# Patient Record
Sex: Male | Born: 1970 | Hispanic: Yes | Marital: Married | Smoking: Former smoker
Health system: Southern US, Community
[De-identification: ages and names within clinical notes are randomized; demographics above are authoritative.]

## PROBLEM LIST (undated history)

## (undated) DIAGNOSIS — K219 Gastro-esophageal reflux disease without esophagitis: Secondary | ICD-10-CM

## (undated) DIAGNOSIS — R519 Headache, unspecified: Secondary | ICD-10-CM

## (undated) DIAGNOSIS — R51 Headache: Secondary | ICD-10-CM

## (undated) HISTORY — DX: Headache, unspecified: R51.9

## (undated) HISTORY — DX: Headache: R51

## (undated) HISTORY — DX: Gastro-esophageal reflux disease without esophagitis: K21.9

---

## 2005-05-19 ENCOUNTER — Emergency Department: Payer: Self-pay | Admitting: Emergency Medicine

## 2013-02-13 ENCOUNTER — Emergency Department: Payer: Self-pay | Admitting: Internal Medicine

## 2013-04-02 DIAGNOSIS — M792 Neuralgia and neuritis, unspecified: Secondary | ICD-10-CM | POA: Insufficient documentation

## 2013-04-02 DIAGNOSIS — E785 Hyperlipidemia, unspecified: Secondary | ICD-10-CM | POA: Insufficient documentation

## 2014-01-15 ENCOUNTER — Encounter: Payer: Self-pay | Admitting: Internal Medicine

## 2014-01-15 ENCOUNTER — Ambulatory Visit (INDEPENDENT_AMBULATORY_CARE_PROVIDER_SITE_OTHER): Payer: BC Managed Care – PPO | Admitting: Internal Medicine

## 2014-01-15 VITALS — BP 116/64 | HR 75 | Temp 97.9°F | Ht 65.5 in | Wt 153.5 lb

## 2014-01-15 DIAGNOSIS — Z0000 Encounter for general adult medical examination without abnormal findings: Secondary | ICD-10-CM

## 2014-01-15 DIAGNOSIS — N410 Acute prostatitis: Secondary | ICD-10-CM

## 2014-01-15 LAB — COMPREHENSIVE METABOLIC PANEL
ALK PHOS: 80 U/L (ref 39–117)
ALT: 28 U/L (ref 0–53)
AST: 24 U/L (ref 0–37)
Albumin: 4.4 g/dL (ref 3.5–5.2)
BUN: 17 mg/dL (ref 6–23)
CO2: 29 mEq/L (ref 19–32)
Calcium: 9.4 mg/dL (ref 8.4–10.5)
Chloride: 103 mEq/L (ref 96–112)
Creatinine, Ser: 0.7 mg/dL (ref 0.4–1.5)
GFR: 127.98 mL/min (ref >60.00)
GLUCOSE: 86 mg/dL (ref 70–99)
Potassium: 4.1 mEq/L (ref 3.5–5.1)
Sodium: 138 mEq/L (ref 135–145)
Total Bilirubin: 0.3 mg/dL (ref 0.3–1.2)
Total Protein: 7.2 g/dL (ref 6.0–8.3)

## 2014-01-15 LAB — LIPID PANEL
CHOLESTEROL: 191 mg/dL (ref 0–200)
HDL: 41.9 mg/dL (ref >39.00)
LDL Cholesterol: 95 mg/dL (ref 0–99)
Total CHOL/HDL Ratio: 5
Triglycerides: 272 mg/dL — ABNORMAL HIGH (ref 0.0–149.0)
VLDL: 54.4 mg/dL — ABNORMAL HIGH (ref 0.0–40.0)

## 2014-01-15 LAB — PSA: PSA: 0.71 ng/mL (ref 0.10–4.00)

## 2014-01-15 LAB — CBC
HEMATOCRIT: 43.8 % (ref 39.0–52.0)
Hemoglobin: 15 g/dL (ref 13.0–17.0)
MCHC: 34.3 g/dL (ref 30.0–36.0)
MCV: 88.9 fl (ref 78.0–100.0)
Platelets: 180 10*3/uL (ref 150.0–400.0)
RBC: 4.93 Mil/uL (ref 4.22–5.81)
RDW: 12.9 % (ref 11.5–14.6)
WBC: 4 10*3/uL — ABNORMAL LOW (ref 4.5–10.5)

## 2014-01-15 MED ORDER — SULFAMETHOXAZOLE-TMP DS 800-160 MG PO TABS
1.0000 | ORAL_TABLET | Freq: Two times a day (BID) | ORAL | Status: DC
Start: 2014-01-15 — End: 2014-03-01

## 2014-01-15 NOTE — Progress Notes (Signed)
HPI  Pt presents to the clinic today to establish care. He has not had a PCP in many years. He does have some concerns today about a pain inside his rectum. He reports this started 1 week ago. He denies fever, chills, dysuria, but feels like his urine stream stops and starts. He denies blood in his urine. He reports he has felt like this in the past before when he had a prostate infection. He reports this was many years ago.  Flu: never Tetanus: 2012 Eye Doctor: as needed Dentist: biannually  Past Medical History  Diagnosis Date  . GERD (gastroesophageal reflux disease)     No current outpatient prescriptions on file.   No current facility-administered medications for this visit.    No Known Allergies  Family History  Problem Relation Age of Onset  . Diabetes Maternal Aunt   . Diabetes Maternal Uncle   . Cancer Neg Hx   . Stroke Neg Hx   . Heart disease Neg Hx     History   Social History  . Marital Status: Married    Spouse Name: N/A    Number of Children: N/A  . Years of Education: N/A   Occupational History  . Not on file.   Social History Main Topics  . Smoking status: Never Smoker   . Smokeless tobacco: Not on file  . Alcohol Use: 7.2 oz/week    12 Cans of beer per week     Comment: occasional  . Drug Use: No  . Sexual Activity: Yes   Other Topics Concern  . Not on file   Social History Narrative  . No narrative on file    ROS:  Constitutional: Denies fever, malaise, fatigue, headache or abrupt weight changes.  HEENT: Denies eye pain, eye redness, ear pain, ringing in the ears, wax buildup, runny nose, nasal congestion, bloody nose, or sore throat. Respiratory: Denies difficulty breathing, shortness of breath, cough or sputum production.   Cardiovascular: Denies chest pain, chest tightness, palpitations or swelling in the hands or feet.  Gastrointestinal: Pt reports rectal pain. Denies abdominal pain, bloating, constipation, diarrhea or blood in the  stool.  GU: Pt reports hesitancy. Denies frequency, urgency, pain with urination, blood in urine, odor or discharge. Musculoskeletal: Denies decrease in range of motion, difficulty with gait, muscle pain or joint pain and swelling.  Skin: Denies redness, rashes, lesions or ulcercations.  Neurological: Denies dizziness, difficulty with memory, difficulty with speech or problems with balance and coordination.   No other specific complaints in a complete review of systems (except as listed in HPI above).  PE:  BP 116/64  Pulse 75  Temp(Src) 97.9 F (36.6 C) (Oral)  Ht 5' 5.5" (1.664 m)  Wt 153 lb 8 oz (69.627 kg)  BMI 25.15 kg/m2  SpO2 98% Wt Readings from Last 3 Encounters:  01/15/14 153 lb 8 oz (69.627 kg)    General: Appears his stated age, well developed, well nourished in NAD. HEENT: Head: normal shape and size; Eyes: sclera white, no icterus, conjunctiva pink, PERRLA and EOMs intact; Ears: Tm's gray and intact, normal light reflex; Nose: mucosa pink and moist, septum midline; Throat/Mouth: Teeth present, mucosa pink and moist, no lesions or ulcerations noted.  Neck: Normal range of motion. Neck supple, trachea midline. No massses, lumps or thyromegaly present.  Cardiovascular: Normal rate and rhythm. S1,S2 noted.  No murmur, rubs or gallops noted. No JVD or BLE edema. No carotid bruits noted. Pulmonary/Chest: Normal effort and positive vesicular breath  sounds. No respiratory distress. No wheezes, rales or ronchi noted.  Abdomen: Soft and nontender. Normal bowel sounds, no bruits noted. No distention or masses noted. Liver, spleen and kidneys non palpable. Rectal: he declines today. Musculoskeletal: Normal range of motion. No signs of joint swelling. No difficulty with gait.  Neurological: Alert and oriented. Cranial nerves II-XII intact. Coordination normal. +DTRs bilaterally. Psychiatric: Mood and affect normal. Behavior is normal. Judgment and thought content normal.       Assessment and Plan:  Preventative Health Maintenance:  All HM UTD Will check screening labs today  Acute prostatis:  Will check CBC and PSA today eRx for Septra BID x 4 weeks  RTC as needed or if pain persist or worsens

## 2014-01-15 NOTE — Patient Instructions (Addendum)
Prostatitis ( Prostatitis) La prstata tiene aproximadamente el tamao y la forma de Sharee Pimple. Se ubica justo por abajo de la vejiga. Produce uno de los componentes del semen, formado por los espermatozoides y los lquidos que ayudan a nutrirlos y transportarlos fuera de los testculos. La prostatitis es una inflamacin de la glndula prosttica.  Hay cuatro tipos de prostatitis:  Prostatitis bacteriana aguda: es el tipo menos frecuente de prostatitis. Comienza rpidamente y, por lo general, est acompaada por una infeccin de la vejiga, fiebre alta y escalofros. Puede ocurrir a Actuary.  Prostatitis bacteriana crnica: es una infeccin bacteriana persistente en la prstata. Generalmente aparece luego de una prostatitis bacteriana aguda que se repite o que no fue tratada adecuadamente. Se puede presentar en hombres de cualquier edad pero es ms comn en los hombres de mediana edad cuya prstata ha comenzado a Government social research officer. Los sntomas no son tan graves como los de la prostatitis [place]. La principal [employer] ser en la parte del cuerpo que est delante del recto y debajo del escroto (perineo), en la parte baja del abdomen o en la punta del pene (glande).   Prostatitis crnica (no bacteriana): es el tipo ms frecuente de prostatitis. Es la inflamacin de la prstata que no se origina en una infeccin bacteriana Se desconoce su causa y puede estar asociada con infecciones virales o trastornos autoinmunitarios.  Prostatodinia (trastorno del suelo plvico): est asociada a un tono muscular elevado en la pelvis que rodea a la prstata. CAUSAS Las causas de la prostatitis bacteriana es la infeccin bacteriana. Se desconocen las causas de los otros tipos de prostatitis.  SNTOMAS  Los sntomas pueden variar segn el tipo de prostatitis. Tambin puede ocurrir que coincidan sntomas de diferentes tipos de prostatitis. Los sntomas posibles para cada tipo de prostatitis se enumeran a  continuacin. Prostatitis bacteriana aguda  Dolor al [employer].  Fiebre o escalofros.  Dolor muscular o en las articulaciones.  Dolor lumbar.  Dolor abdominal en la zona inferior del abdomen.  Imposibilidad de vaciar la vejiga completamente. Prostatitis bacteriana crnica, prostatitis crnica no bacteriana y prostatodinia  Congo urgente y repentina de Geographical information systems officer.  Orinar con frecuencia.  Dificultad para comenzar a eliminar la orina.  Chorro de orina dbil.  Secrecin por Engineer, mining.  Goteo al terminar de Geographical information systems officer.  Dolor rectal.  Dolor en los testculos, el pene, o punta del pene.  Dolor en el perineo.  Problemas con la funcin sexual.  Eyaculacin dolorosa.  Semen con sangre. DIAGNSTICO  Su mdico le preguntar acerca de sus sntomas para hacer el diagnstico de prostatitis. Se recolectarn y analizarn una o ms muestras de [place] (anlisis de [place]). Si el resultado del anlisis de [place] es negativo para bacterias, su mdico podr palpar su prstata con un dedo (examen dgito rectal). Este examen ayuda a su mdico a determinar si la prstata est inflamada y sensible. Tambin se obtendr Lauris Poag de semen para analizarla. TRATAMIENTO  El tratamiento de la prostatitis depende de la causa. Si una infeccin bacteriana es la causa, puede ser tratada con antibiticos. En los casos de prostatitis bacteriana crnica, puede ser necesario el uso de antibiticos durante un de un mes o seis semanas. Su mdico puede indicarle que tome baos de asiento para ayudar a Engineer, materials. Un bao de asiento es un bao de agua caliente en la que las caderas y las nalgas estn bajo el agua. Esto relaja los msculos del suelo plvico y, a menudo, contribuye a Technical sales engineer presin  en la prstata. INSTRUCCIONES PARA EL CUIDADO EN EL HOGAR   Tome todos los medicamentos como le indic el mdico.  Tome baos de asiento segn las indicaciones del mdico. SOLICITE ATENCIN MDICA SI:   Los  sntomas empeoran en lugar de mejorar.  Tiene fiebre. SOLICITE ATENCIN MDICA DE INMEDIATO SI:   Tiene escalofros.  Siente nuseas o vomita.  Se siente mareado o se desmaya.  No puede orinar.  Tiene sangre o cogulos de sangre en la orina. Document Released: 06/16/2005 Document Revised: 06/27/2013 [facility] Patient Information 2014 [place]Care, [place]. Prostatitis The prostate gland is about the size and shape of a walnut. It is located just below your bladder. It produces one of the components of semen, which is made up of sperm and the fluids that help nourish and transport it out from the testicles. Prostatitis is inflammation of the prostate gland.  There are four types of prostatitis:  Acute bacterial prostatitis This is the least common type of prostatitis. It starts quickly and usually is associated with a bladder infection, high fever, and shaking chills. It can occur at any age.  Chronic bacterial prostatitis This is a persistent bacterial infection in the prostate. It usually develops from repeated acute bacterial prostatitis or acute bacterial prostatitis that was not properly treated. It can occur in men of any age but is most common in middle-aged men whose prostate has begun to enlarge. The symptoms are not as severe as those in acute bacterial prostatitis. Discomfort in the part of your body that is in front of your rectum and below your scrotum (perineum), lower abdomen, or in the head of your penis (glans) may represent your primary discomfort.  Chronic prostatitis (nonbacterial) This is the most common type of prostatitis. It is inflammation of the prostate gland that is not caused by a bacterial infection. The cause is unknown and may be associated with a viral infection or autoimmune disorder.  Prostatodynia (pelvic floor disorder) This is associated with increased muscular tone in the pelvis surrounding the prostate. CAUSES The causes of bacterial prostatitis are  bacterial infection. The causes of the other types of prostatitis are unknown.  SYMPTOMS  Symptoms can vary depending upon the type of prostatitis that exists. There can also be overlap in symptoms. Possible symptoms for each type of prostatitis are listed below. Acute Bacterial Prostatitis  Painful urination.  Fever or chills.  Muscle or joint pains.  Low back pain.  Low abdominal pain.  Inability to empty bladder completely. Chronic Bacterial Prostatitis, Chronic Nonbacterial Prostatitis, and Prostatodynia  Sudden urge to urinate.  Frequent urination.  Difficulty starting urine stream.  Weak urine stream.  Discharge from the urethra.  Dribbling after urination.  Rectal pain.  Pain in the testicles, penis, or tip of the penis.  Pain in the perineum.  Problems with sexual function.  Painful ejaculation.  Bloody semen. DIAGNOSIS  In order to diagnose prostatitis, your health care provider will ask about your symptoms. One or more urine samples will be taken and tested (urinalysis). If the urinalysis result is negative for bacteria, your health care provider may use a finger to feel your prostate (digital rectal exam). This exam helps your health care provider determine if your prostate is swollen and tender. It will also produce a specimen of semen that can be analyzed. TREATMENT  Treatment for prostatitis depends on the cause. If a bacterial infection is the cause, it can be treated with antibiotic medicine. In cases of chronic bacterial prostatitis, the use  of antibiotics for up to 1 month or 6 weeks may be necessary. Your health care provider may instruct you to take sitz baths to help relieve pain. A sitz bath is a bath of hot water in which your hips and buttocks are under water. This relaxes the pelvic floor muscles and often helps to relieve the pressure on your prostate. HOME CARE INSTRUCTIONS   Take all medicines as directed by your health care provider.  Take  sitz baths as directed by your health care provider. SEEK MEDICAL CARE IF:   Your symptoms get worse, not better.  You have a fever. SEEK IMMEDIATE MEDICAL CARE IF:   You have chills.  You feel nauseous or vomit.  You feel lightheaded or faint.  You are unable to urinate.  You have blood or blood clots in your urine. Document Released: 09/03/2000 Document Revised: 06/27/2013 Document Reviewed: 03/26/2013 [facility] Patient Information 2014 [facility] CollegeExitCare, MarylandLLC.

## 2014-01-15 NOTE — Progress Notes (Signed)
Pre visit review using our clinic review tool, if applicable. No additional management support is needed unless otherwise documented below in the visit note. 

## 2014-03-01 ENCOUNTER — Encounter: Payer: Self-pay | Admitting: Family Medicine

## 2014-03-01 ENCOUNTER — Ambulatory Visit: Payer: BC Managed Care – PPO | Admitting: Internal Medicine

## 2014-03-01 ENCOUNTER — Ambulatory Visit (INDEPENDENT_AMBULATORY_CARE_PROVIDER_SITE_OTHER): Payer: BC Managed Care – PPO | Admitting: Family Medicine

## 2014-03-01 ENCOUNTER — Ambulatory Visit: Payer: BC Managed Care – PPO | Admitting: Family Medicine

## 2014-03-01 VITALS — BP 124/84 | HR 60 | Temp 98.3°F | Wt 156.8 lb

## 2014-03-01 DIAGNOSIS — M79673 Pain in unspecified foot: Secondary | ICD-10-CM

## 2014-03-01 DIAGNOSIS — N342 Other urethritis: Secondary | ICD-10-CM | POA: Insufficient documentation

## 2014-03-01 DIAGNOSIS — M79609 Pain in unspecified limb: Secondary | ICD-10-CM

## 2014-03-01 DIAGNOSIS — R300 Dysuria: Secondary | ICD-10-CM

## 2014-03-01 LAB — POCT URINALYSIS DIPSTICK
Bilirubin, UA: NEGATIVE
Blood, UA: NEGATIVE
Glucose, UA: NEGATIVE
KETONES UA: NEGATIVE
LEUKOCYTES UA: NEGATIVE
Nitrite, UA: NEGATIVE
PH UA: 8
Protein, UA: NEGATIVE
SPEC GRAV UA: 1.01
Urobilinogen, UA: 0.2

## 2014-03-01 MED ORDER — CEFTRIAXONE SODIUM 1 G IJ SOLR
250.0000 mg | Freq: Once | INTRAMUSCULAR | Status: AC
  Administered 2014-03-01: 250 mg via INTRAMUSCULAR

## 2014-03-01 MED ORDER — DOXYCYCLINE HYCLATE 100 MG PO CAPS: 100.0000 mg | ORAL_CAPSULE | Freq: Two times a day (BID) | ORAL | Status: DC

## 2014-03-01 NOTE — Patient Instructions (Addendum)
Creo que la prostatitis que tuvo recientemente ha sido tratado. Creo todavia tiene infeccion - medicina recetada hoy para esto - inyeccion y doxycycline 100mg  dos veces al dia por 1 semana. Para la fatiga, demos mas tiempo - si sigue con este problema, dejenos saber para de pronto revisar mas laboratorios. Siga tomando [place] O[place]Lakesmucha agua. Para los pies - puede usar ibuprofeno or advil 400mg  dos veces al dia con cominda.

## 2014-03-01 NOTE — Progress Notes (Signed)
BP 124/84  Pulse 60  Temp(Src) 98.3 F (36.8 C) (Oral)  Wt 156 lb 12 oz (71.101 kg)   CC: dysuria  Subjective:    Patient ID: Alejandro Barr, male    DOB: 04/21/1971, 43 y.o.   MRN: [id number]  HPI: Alejandro Barr is a 43 y.o. male presenting on 03/01/2014 for burning with urination   Husband of patient of mine Alejandro Barr. Spanish speaking.Alejandro Barr.  Alejandro Barr presents with 1 mo h/o penile discomfort described as burn.  Occasional abd discomfort but difficulty describing. Some dysuria, some constipation. Some early ejaculation (longstanding).   Drinking plenty of water.  1 yr ago did have prostatitis. Again had this 1 mo ago - saw Alejandro Barr 01/15/2014 with rectal pain, dx prostatitis and treated with 4wk course bactrim BID, pt reports finishing abx and did feel better while on antibiotics.  Now main complaint is penile discomfort.  Denies fevers/chills, nausea/vomiting, diarrhea. Denies urgency or frequency or hematuria. No urethral discharge. Denies new sexual partners, endorses monogamous relationship.  For last 2 weeks also noticing increase in fatigue as well as decreased libido, some mild depressive sxs.  Also describes longstanding R sole burning pain with ambulation - improved after he bought custom insoles from [employer] but still somewhat present.  Relevant past medical, surgical, family and social history reviewed and updated as indicated.  Allergies and medications reviewed and updated. No current outpatient prescriptions on file prior to visit.   No current facility-administered medications on file prior to visit.    Review of Systems Per HPI unless specifically indicated above    Objective:    BP 124/84  Pulse 60  Temp(Src) 98.3 F (36.8 C) (Oral)  Wt 156 lb 12 oz (71.101 kg)  Physical Exam  Nursing note and vitals reviewed. Constitutional: He appears well-developed and well-nourished. No distress.  HENT:  Mouth/Throat: Oropharynx is clear and moist. No  oropharyngeal exudate.  Cardiovascular: Normal rate, regular rhythm, normal heart sounds and intact distal pulses.   No murmur heard. Pulmonary/Chest: Effort normal and breath sounds normal. No respiratory distress. He has no wheezes. He has no rales.  Abdominal: Soft. Normal appearance and bowel sounds are normal. He exhibits no distension and no mass. There is no tenderness. There is no rigidity, no rebound, no guarding, no CVA tenderness and negative Murphy's sign.  Genitourinary: Rectum normal, prostate normal, testes normal and penis normal. Rectal exam shows no external hemorrhoid, no internal hemorrhoid, no fissure, no mass, no tenderness and anal tone normal. Prostate is not enlarged (20gm) and not tender. Right testis shows no mass, no swelling and no tenderness. Right testis is descended. Left testis shows no mass, no swelling and no tenderness. Left testis is descended. Uncircumcised.  Musculoskeletal: He exhibits no edema.  Lymphadenopathy:       Right: No inguinal adenopathy present.       Left: No inguinal adenopathy present.  Skin: Skin is warm and dry. No rash noted.   Results for orders placed in visit on 03/01/14  POCT URINALYSIS DIPSTICK      Result Value Ref Range   Color, UA Yellow     Clarity, UA Clear     Glucose, UA Negative     Bilirubin, UA Negative     Ketones, UA Negative     Spec Grav, UA 1.010     Blood, UA Negative     pH, UA 8.0     Protein, UA Negative     Urobilinogen, UA 0.2  Nitrite, UA Negative     Leukocytes, UA Negative        Assessment & Plan:   Problem List Items Addressed This Visit   Urethritis - Primary     Penile burn most consistent with urethritis, ddx includes UTI, STD. GU exam WNL today, prostate exam as well without pain. Will treat with shot CTX 250mg  IM as well as doxy 7d course. Update if sxs persist or deteriorate. If persistent dysthymia, fatigue and decreased libido, consider testing for testosterone.    Relevant  Medications      cefTRIAXone (ROCEPHIN) injection 250 mg (Completed)   Other Relevant Orders      Urine culture   Foot pain     R sided - ?plantar fasciitis - provided with stretching exercises from SM pt advisor. Also recommended OTC NSAID with food.     Other Visit Diagnoses   Burning with urination        Relevant Medications       cefTRIAXone (ROCEPHIN) injection 250 mg (Completed)    Other Relevant Orders       POCT Urinalysis Dipstick (Completed)        Follow up plan: Return if symptoms worsen or fail to improve.

## 2014-03-01 NOTE — Progress Notes (Signed)
Pre visit review using our clinic review tool, if applicable. No additional management support is needed unless otherwise documented below in the visit note. 

## 2014-03-02 DIAGNOSIS — M79673 Pain in unspecified foot: Secondary | ICD-10-CM | POA: Insufficient documentation

## 2014-03-02 NOTE — Assessment & Plan Note (Signed)
Penile burn most consistent with urethritis, ddx includes UTI, STD. GU exam WNL today, prostate exam as well without pain. Will treat with shot CTX 250mg  IM as well as doxy 7d course. Update if sxs persist or deteriorate. If persistent dysthymia, fatigue and decreased libido, consider testing for testosterone.

## 2014-03-02 NOTE — Assessment & Plan Note (Addendum)
R sided - ?plantar fasciitis - provided with stretching exercises from [facility] pt advisor. Also recommended OTC NSAID with food.

## 2014-03-04 ENCOUNTER — Telehealth: Payer: Self-pay | Admitting: Radiology

## 2014-03-04 NOTE — Telephone Encounter (Signed)
Lm with his wife to have the patient return to leave a clean catch urine for culture.

## 2014-03-08 LAB — URINE CULTURE
Colony Count: NO GROWTH
Organism ID, Bacteria: NO GROWTH

## 2014-12-12 ENCOUNTER — Encounter: Payer: Self-pay | Admitting: Family Medicine

## 2014-12-12 ENCOUNTER — Ambulatory Visit (INDEPENDENT_AMBULATORY_CARE_PROVIDER_SITE_OTHER): Payer: BLUE CROSS/BLUE SHIELD | Admitting: Family Medicine

## 2014-12-12 VITALS — BP 110/70 | HR 60 | Temp 97.9°F | Resp 16 | Wt 159.8 lb

## 2014-12-12 DIAGNOSIS — R51 Headache: Secondary | ICD-10-CM | POA: Diagnosis not present

## 2014-12-12 DIAGNOSIS — R519 Headache, unspecified: Secondary | ICD-10-CM | POA: Insufficient documentation

## 2014-12-12 NOTE — Patient Instructions (Signed)
Use mascara en el trabajo. Puede usar tylenol si es necesario para dolor - 500-1000mg . Si esta teniendo que usar tylenol mas de 4 veces a la semana dejeme saber.  Creo que alergias estan contribuyendo al dolor de [place]. Empieze flonase.  Dejeme saber como sigue.

## 2014-12-12 NOTE — Assessment & Plan Note (Addendum)
nonfocal neurological exam. Anticipate sinus congestion headache. Treat with flonase (pt has at home). Discussed tylenol use.  Update if not improving with treatment. Also recommended he schedule vision exam with eye doctor (has not had in past).

## 2014-12-12 NOTE — Progress Notes (Signed)
BP 110/70 mmHg  Pulse 60  Temp(Src) 97.9 F (36.6 C) (Oral)  Resp 16  Wt 159 lb 12.8 oz (72.485 kg)  SpO2 98%   CC: headcahe  Subjective:    Patient ID: Alejandro Barr, male    DOB: [birth year], 44 y.o.   MRN: [id number]  HPI: Alejandro Barr is a 44 y.o. male presenting on 12/12/2014 for Headache   2-3 wk h/o bitemporal headache that radiates to entire head. Intermittently. Wakes up in am with headache, feels also at night time. Yesterday constantly present. Sinus pressure / congestion > pounding ache. Yesterday had some blurry vision and dizziness and nausea. Occasional allergy sxs.   No fevers/chills, vomiting, photo/phonophobia. Thinks he's staying well hydrated. No neck pain or stiffness.   Has had similar headache - about 17 yrs ago. Due to chemical inhalation.   Hasn't tried med for this yet other than tylenol (yesterday ) which resolved headache, currently without any.  Denies chemical exposure other than smoke at work. welds iron pipes at work. No recent travel.  Limited caffeine - 1 coffee per day, rare soda.  Occasional energy drink in am.  Sleeping 7-8 hours on average.  Relevant past medical, surgical, family and social history reviewed and updated as indicated. Interim medical history since our last visit reviewed. Allergies and medications reviewed and updated. No current outpatient prescriptions on file prior to visit.   No current facility-administered medications on file prior to visit.    Review of Systems Per HPI unless specifically indicated above     Objective:    BP 110/70 mmHg  Pulse 60  Temp(Src) 97.9 F (36.6 C) (Oral)  Resp 16  Wt 159 lb 12.8 oz (72.485 kg)  SpO2 98%  Wt Readings from Last 3 Encounters:  12/12/14 159 lb 12.8 oz (72.485 kg)  03/01/14 156 lb 12 oz (71.101 kg)  01/15/14 153 lb 8 oz (69.627 kg)    Physical Exam  Constitutional: He is oriented to person, place, and time. He appears well-developed and well-nourished.  No distress.  HENT:  Head: Normocephalic and atraumatic.  Right Ear: Hearing, tympanic membrane, external ear and ear canal normal.  Left Ear: Hearing, tympanic membrane, external ear and ear canal normal.  Nose: Mucosal edema (mild) present. No rhinorrhea. Right sinus exhibits no maxillary sinus tenderness and no frontal sinus tenderness. Left sinus exhibits no maxillary sinus tenderness and no frontal sinus tenderness.  Mouth/Throat: Uvula is midline, oropharynx is clear and moist and mucous membranes are normal. No oropharyngeal exudate, posterior oropharyngeal edema, posterior oropharyngeal erythema or tonsillar abscesses.  Eyes: Conjunctivae and EOM are normal. Pupils are equal, round, and reactive to light. No scleral icterus.  Neck: Normal range of motion. Neck supple.  Cardiovascular: Normal rate, regular rhythm, normal heart sounds and intact distal pulses.   No murmur heard. Pulmonary/Chest: Effort normal and breath sounds normal. No respiratory distress. He has no wheezes. He has no rales.  Lymphadenopathy:    He has no cervical adenopathy.  Neurological: He is alert and oriented to person, place, and time. No cranial nerve deficit. He displays a negative Romberg sign. Coordination and gait normal.  CN 2-12  FTN intact No pronator drift  Skin: Skin is warm and dry. No rash noted.  Nursing note and vitals reviewed.     Assessment & Plan:   Problem List Items Addressed This Visit    Headache - Primary    nonfocal neurological exam. Anticipate sinus congestion headache. Treat with flonase (pt has  at home). Discussed tylenol use.  Update if not improving with treatment. Also recommended he schedule vision exam with eye doctor (has not had in past).      Relevant Medications   acetaminophen (TYLENOL) 500 MG tablet       Follow up plan: Return if symptoms worsen or fail to improve.

## 2014-12-28 ENCOUNTER — Emergency Department
Admit: 2014-12-28 | Disposition: A | Discharge status: Home / Self Care | Payer: Self-pay | Admitting: Physician Assistant

## 2017-03-30 DIAGNOSIS — J342 Deviated nasal septum: Secondary | ICD-10-CM | POA: Diagnosis not present

## 2017-03-30 DIAGNOSIS — K131 Cheek and lip biting: Secondary | ICD-10-CM | POA: Diagnosis not present

## 2017-03-30 DIAGNOSIS — K141 Geographic tongue: Secondary | ICD-10-CM | POA: Diagnosis not present

## 2017-03-30 DIAGNOSIS — K0820 Unspecified atrophy of edentulous alveolar ridge: Secondary | ICD-10-CM | POA: Diagnosis not present

## 2017-03-30 DIAGNOSIS — Z1281 Encounter for screening for malignant neoplasm of oral cavity: Secondary | ICD-10-CM | POA: Diagnosis not present

## 2017-03-30 DIAGNOSIS — K05329 Chronic periodontitis, generalized, unspecified severity: Secondary | ICD-10-CM | POA: Diagnosis not present

## 2017-06-24 DIAGNOSIS — Z23 Encounter for immunization: Secondary | ICD-10-CM | POA: Diagnosis not present

## 2017-09-19 ENCOUNTER — Encounter: Payer: Self-pay | Admitting: Emergency Medicine

## 2017-09-19 ENCOUNTER — Emergency Department
Admission: EM | Admit: 2017-09-19 | Discharge: 2017-09-19 | Disposition: A | Discharge status: Home / Self Care | Payer: BLUE CROSS/BLUE SHIELD | Attending: Emergency Medicine | Admitting: Emergency Medicine

## 2017-09-19 ENCOUNTER — Emergency Department: Payer: BLUE CROSS/BLUE SHIELD

## 2017-09-19 ENCOUNTER — Other Ambulatory Visit: Payer: Self-pay

## 2017-09-19 DIAGNOSIS — R42 Dizziness and giddiness: Secondary | ICD-10-CM | POA: Diagnosis not present

## 2017-09-19 LAB — CBC
HEMATOCRIT: 46.1 % (ref 40.0–52.0)
Hemoglobin: 15.7 g/dL (ref 13.0–18.0)
MCH: 30.1 pg (ref 26.0–34.0)
MCHC: 34 g/dL (ref 32.0–36.0)
MCV: 88.4 fL (ref 80.0–100.0)
Platelets: 164 10*3/uL (ref 150–440)
RBC: 5.21 MIL/uL (ref 4.40–5.90)
RDW: 13 % (ref 11.5–14.5)
WBC: 5.2 10*3/uL (ref 3.8–10.6)

## 2017-09-19 LAB — URINALYSIS, COMPLETE (UACMP) WITH MICROSCOPIC
BACTERIA UA: NONE SEEN
Bilirubin Urine: NEGATIVE
Glucose, UA: NEGATIVE mg/dL
Hgb urine dipstick: NEGATIVE
KETONES UR: NEGATIVE mg/dL
LEUKOCYTES UA: NEGATIVE
Nitrite: NEGATIVE
PROTEIN: NEGATIVE mg/dL
SQUAMOUS EPITHELIAL / LPF: NONE SEEN
Specific Gravity, Urine: 1.017 (ref 1.005–1.030)
pH: 8 (ref 5.0–8.0)

## 2017-09-19 LAB — BASIC METABOLIC PANEL
Anion gap: 9 (ref 5–15)
BUN: 15 mg/dL (ref 6–20)
CHLORIDE: 106 mmol/L (ref 101–111)
CO2: 25 mmol/L (ref 22–32)
CREATININE: 0.7 mg/dL (ref 0.61–1.24)
Calcium: 9.5 mg/dL (ref 8.9–10.3)
GFR calc non Af Amer: >60 mL/min (ref >60)
Glucose, Bld: 124 mg/dL — ABNORMAL HIGH (ref 65–99)
POTASSIUM: 3.9 mmol/L (ref 3.5–5.1)
Sodium: 140 mmol/L (ref 135–145)

## 2017-09-19 LAB — TROPONIN I

## 2017-09-19 MED ORDER — MECLIZINE HCL 25 MG PO TABS: 25.0000 mg | ORAL_TABLET | Freq: Three times a day (TID) | ORAL | 0 refills | Status: AC | PRN

## 2017-09-19 MED ORDER — MECLIZINE HCL 25 MG PO TABS
50.0000 mg | ORAL_TABLET | Freq: Once | ORAL | Status: AC
  Administered 2017-09-19: 50 mg via ORAL
  Filled 2017-09-19: qty 2

## 2017-09-19 MED ORDER — SODIUM CHLORIDE 0.9 % IV BOLUS (SEPSIS)
1000.0000 mL | Freq: Once | INTRAVENOUS | Status: AC
  Administered 2017-09-19: 1000 mL via INTRAVENOUS

## 2017-09-19 NOTE — Discharge Instructions (Signed)
Fortunately today your blood work and your MRI were very reassuring.  Please take your meclizine as needed for dizziness and make sure you remain well-hydrated.  Follow-up with your primary care physician this coming Monday for recheck.  Return to the emergency department sooner for any concerns whatsoever.  It was a pleasure to take care of you today, and thank you for coming to our emergency department.  If you have any questions or concerns before leaving please ask the nurse to grab me and I'm more than happy to go through your aftercare instructions again.  If you were prescribed any opioid pain medication today such as Norco, Vicodin, Percocet, morphine, hydrocodone, or oxycodone please make sure you do not drive when you are taking this medication as it can alter your ability to drive safely.  If you have any concerns once you are home that you are not improving or are in fact getting worse before you can make it to your follow-up appointment, please do not hesitate to call 911 and come back for further evaluation.  Merrily BrittleNeil Massiel Stipp, MD  Results for orders placed or performed during the hospital encounter of 09/19/17  Basic metabolic panel  Result Value Ref Range   Sodium 140 135 - 145 mmol/L   Potassium 3.9 3.5 - 5.1 mmol/L   Chloride 106 101 - 111 mmol/L   CO2 25 22 - 32 mmol/L   Glucose, Bld 124 (H) 65 - 99 mg/dL   BUN 15 6 - 20 mg/dL   Creatinine, Ser 1.610.70 0.61 - 1.24 mg/dL   Calcium 9.5 8.9 - 09.610.3 mg/dL   GFR calc non Af Amer >60 >60 mL/min   GFR calc Af Amer >60 >60 mL/min   Anion gap 9 5 - 15  CBC  Result Value Ref Range   WBC 5.2 3.8 - 10.6 K/uL   RBC 5.21 4.40 - 5.90 MIL/uL   Hemoglobin 15.7 13.0 - 18.0 g/dL   HCT 04.546.1 40.940.0 - 81.152.0 %   MCV 88.4 80.0 - 100.0 fL   MCH 30.1 26.0 - 34.0 pg   MCHC 34.0 32.0 - 36.0 g/dL   RDW 91.413.0 78.211.5 - 95.614.5 %   Platelets 164 150 - 440 K/uL  Urinalysis, Complete w Microscopic  Result Value Ref Range   Color, Urine YELLOW (A) YELLOW   APPearance CLEAR (A) CLEAR   Specific Gravity, Urine 1.017 1.005 - 1.030   pH 8.0 5.0 - 8.0   Glucose, UA NEGATIVE NEGATIVE mg/dL   Hgb urine dipstick NEGATIVE NEGATIVE   Bilirubin Urine NEGATIVE NEGATIVE   Ketones, ur NEGATIVE NEGATIVE mg/dL   Protein, ur NEGATIVE NEGATIVE mg/dL   Nitrite NEGATIVE NEGATIVE   Leukocytes, UA NEGATIVE NEGATIVE   RBC / HPF 0-5 0 - 5 RBC/hpf   WBC, UA 0-5 0 - 5 WBC/hpf   Bacteria, UA NONE SEEN NONE SEEN   Squamous Epithelial / LPF NONE SEEN NONE SEEN  Troponin I  Result Value Ref Range   Troponin I <0.03 <0.03 ng/mL   Mr Brain Wo Contrast (neuro Protocol)  Result Date: 09/19/2017 CLINICAL DATA:  Initial evaluation for acute vertigo, nausea, vomiting. EXAM: MRI HEAD WITHOUT CONTRAST TECHNIQUE: Multiplanar, multiecho pulse sequences of the brain and surrounding structures were obtained without intravenous contrast. COMPARISON:  None. FINDINGS: Brain: Cerebral volume within normal limits for patient age. No focal parenchymal signal abnormality identified. No abnormal foci of restricted diffusion to suggest acute or subacute ischemia. Gray-white matter differentiation well maintained. No encephalomalacia to suggest  chronic infarction. No foci of susceptibility artifact to suggest acute or chronic intracranial hemorrhage. No mass lesion, midline shift or mass effect. No hydrocephalus. No extra-axial fluid collection. Major dural sinuses are grossly patent. Retrocerebellar cystic lesion may reflect an arachnoid cyst versus mega cisterna magna. Pituitary gland and suprasellar region are normal. Midline structures intact and normal. Vascular: Major intracranial vascular flow voids well maintained and normal in appearance. Skull and upper cervical spine: Craniocervical junction normal. Visualized upper cervical spine within normal limits. Bone marrow signal intensity normal. No scalp soft tissue abnormality. Sinuses/Orbits: Globes and orbital soft tissues within normal  limits. Paranasal sinuses are clear. No mastoid effusion. Inner ear structures normal. Other: None. IMPRESSION: Normal brain MRI.  No acute intracranial abnormality identified. Electronically Signed   By: Rise MuBenjamin  McClintock M.D.   On: 09/19/2017 19:07

## 2017-09-19 NOTE — ED Notes (Signed)
Pt unable to urinate at this time, given specimen cup for when is able to void.  

## 2017-09-19 NOTE — ED Notes (Signed)
Patient transported to MRI 

## 2017-09-19 NOTE — ED Provider Notes (Signed)
[facility] Emergency Department Provider Note  ____________________________________________   First MD Initiated Contact with Patient 09/19/17 1541     (approximate)  I have reviewed the triage vital signs and the nursing notes.   HISTORY  Chief Complaint Dizziness   HPI Alejandro Barr is a 46 y.o. male who self presents to the emergency department with constant dizziness that began this morning when he awoke.  His dizziness is mild to moderate somewhat worse with movement and when standing up and never completely goes away.  It is associated with some nausea and he is vomited several times.  He has no pain.  No headache.  No double vision or blurred vision.  He has no past medical history and takes no medications although he does not have a primary care physician.  He says it feels like the room is spinning and that he is standing on a boat.  Nothing in particular seems to make it go away.  Past Medical History:  Diagnosis Date  . GERD (gastroesophageal reflux disease)     Patient Active Problem List   Diagnosis Date Noted  . Headache 12/12/2014  . Foot pain 03/02/2014  . Urethritis 03/01/2014    History reviewed. No pertinent surgical history.  Prior to Admission medications   Medication Sig Start Date End Date Taking? Authorizing Provider  acetaminophen (TYLENOL) 500 MG tablet Take 500 mg by mouth every 6 (six) hours as needed for headache.    [provider]  meclizine (ANTIVERT) 25 MG tablet Take 1 tablet (25 mg total) by mouth 3 (three) times daily as needed for up to 7 days for dizziness. 09/19/17 09/26/17  Merrily Brittle, MD    Allergies Patient has no known allergies.  Family History  Problem Relation Age of Onset  . Diabetes Maternal Aunt   . Diabetes Maternal Uncle   . Cancer Neg Hx   . Stroke Neg Hx   . Heart disease Neg Hx     Social History Social History   Tobacco Use  . Smoking status: Never Smoker  .  Smokeless tobacco: Never Used  Substance Use Topics  . Alcohol use: Yes    Alcohol/week: 7.2 oz    Types: 12 Cans of beer per week    Comment: occasional  . Drug use: No    Review of Systems Constitutional: No fever/chills Eyes: No visual changes. ENT: No sore throat. Cardiovascular: Denies chest pain. Respiratory: Denies shortness of breath. Gastrointestinal: No abdominal pain.  Positive for nausea, positive for vomiting.  No diarrhea.  No constipation. Genitourinary: Negative for dysuria. Musculoskeletal: Negative for back pain. Skin: Negative for rash. Neurological: Negative for headache   ____________________________________________   PHYSICAL EXAM:  VITAL SIGNS: ED Triage Vitals  Enc Vitals Group     BP 09/19/17 1214 114/69     Pulse Rate 09/19/17 1214 74     Resp 09/19/17 1214 18     Temp 09/19/17 1214 (!) 97.5 F (36.4 C)     Temp Source 09/19/17 1214 Oral     SpO2 09/19/17 1214 97 %     Weight 09/19/17 1210 155 lb (70.3 kg)     Height 09/19/17 1210 5\' 5"  (1.651 m)     Head Circumference --      Peak Flow --      Pain Score --      Pain Loc --      Pain Edu? --      Excl. in GC? --  Constitutional: Alert and oriented x4 appears somewhat uncomfortable in bed sitting very still Eyes: PERRL EOMI. some rotary nystagmus on leftward gaze Head: Atraumatic.  Normal tympanic membranes bilaterally Nose: No congestion/rhinnorhea. Mouth/Throat: No trismus Neck: No stridor.   Cardiovascular: Normal rate, regular rhythm. Grossly normal heart sounds.  Good peripheral circulation. Respiratory: Normal respiratory effort.  No retractions. Lungs CTAB and moving good air Gastrointestinal: Soft nontender Musculoskeletal: No lower extremity edema   Neurologic:  Normal speech and language.  Cranial nerves II through XII intact aside from some rotary nystagmus on leftward gaze No pronator drift 5 out of 5 grips biceps triceps hip flexion hip extension plantar flexion  dorsiflexion Normal finger-nose-finger bilaterally He does have truncal ataxia and difficulty ambulating Skin:  Skin is warm, dry and intact. No rash noted. Psychiatric: Mood and affect are normal. Speech and behavior are normal.    ____________________________________________   DIFFERENTIAL includes but not limited to  Central vertigo, peripheral vertigo, dehydration, lightheaded ____________________________________________   LABS (all labs ordered are listed, but only abnormal results are displayed)  Labs Reviewed  BASIC METABOLIC PANEL - Abnormal; Notable for the following components:      Result Value   Glucose, Bld 124 (*)    All other components within normal limits  URINALYSIS, COMPLETE (UACMP) WITH MICROSCOPIC - Abnormal; Notable for the following components:   Color, Urine YELLOW (*)    APPearance CLEAR (*)    All other components within normal limits  CBC  TROPONIN I  CBG MONITORING, ED    Lab work reviewed by me no acute disease __________________________________________  EKG  ED ECG REPORT I, Merrily BrittleNeil Cordaryl Decelles, the attending physician, personally viewed and interpreted this ECG.  Date: 09/19/2017 EKG Time:  Rate: 70 Rhythm: normal sinus rhythm QRS Axis: normal Intervals: normal ST/T Wave abnormalities: normal Narrative Interpretation: no evidence of acute ischemia  ____________________________________________  RADIOLOGY  MRI of the brain reviewed by me with no acute disease ____________________________________________   PROCEDURES  Procedure(s) performed: no  Procedures  Critical Care performed: no  Observation: no ____________________________________________   INITIAL IMPRESSION / ASSESSMENT AND PLAN / ED COURSE  Pertinent labs & imaging results that were available during my care of the patient were reviewed by me and considered in my medical decision making (see chart for details).  It is difficult to tease out whether the patient's  symptoms are lightheadedness or vertigo.  His neuro exam is relatively unremarkable although when attempting to ambulate he is somewhat unsteady on his feet.  He has equivocal rotational nystagmus on leftward gaze and given this constellation of symptoms I am somewhat concerned about posterior stroke so MRI of the brain has been ordered.  Fluids and meclizine in the meantime.  ----------------------------------------- 7:27 PM on 09/19/2017 -----------------------------------------  Fortunately the patient's MRI is negative for acute pathology.  He remains neuro intact and is more steady on his feet.  She has been on monitor for several hours with no ectopy noted.  I had a lengthy discussion with the patient regarding the diagnostic uncertainty and I have encouraged him to remain well-hydrated and take meclizine as needed for severe symptoms.  He is to follow-up with his primary care physician within 1 week for recheck.  Patient verbalizes understanding and agreement the plan.      ____________________________________________   FINAL CLINICAL IMPRESSION(S) / ED DIAGNOSES  Final diagnoses:  Dizziness      NEW MEDICATIONS STARTED DURING THIS VISIT:  This SmartLink is deprecated. Use AVSMEDLIST instead to  display the medication list for a patient.   Note:  This document was prepared using Dragon voice recognition software and may include unintentional dictation errors.     Merrily Brittleifenbark, Deloras Reichard, MD 09/19/17 Serena [id number]

## 2017-09-19 NOTE — ED Triage Notes (Signed)
Pt c/o feeling lightheaded that started this morning.  Has had some nausea and vomiting. Denies pain.  Denies weakness/tingling.  Does seem to improve when sitting still.  No hx vertigo.

## 2017-10-11 DIAGNOSIS — M9903 Segmental and somatic dysfunction of lumbar region: Secondary | ICD-10-CM | POA: Diagnosis not present

## 2017-10-11 DIAGNOSIS — M955 Acquired deformity of pelvis: Secondary | ICD-10-CM | POA: Diagnosis not present

## 2017-10-11 DIAGNOSIS — M9905 Segmental and somatic dysfunction of pelvic region: Secondary | ICD-10-CM | POA: Diagnosis not present

## 2017-10-11 DIAGNOSIS — M5416 Radiculopathy, lumbar region: Secondary | ICD-10-CM | POA: Diagnosis not present

## 2017-11-14 DIAGNOSIS — S70362A Insect bite (nonvenomous), left thigh, initial encounter: Secondary | ICD-10-CM | POA: Diagnosis not present

## 2017-11-19 DIAGNOSIS — L02419 Cutaneous abscess of limb, unspecified: Secondary | ICD-10-CM | POA: Diagnosis not present

## 2017-11-19 DIAGNOSIS — L03119 Cellulitis of unspecified part of limb: Secondary | ICD-10-CM | POA: Diagnosis not present

## 2018-03-03 DIAGNOSIS — Z13220 Encounter for screening for lipoid disorders: Secondary | ICD-10-CM | POA: Diagnosis not present

## 2018-03-03 DIAGNOSIS — Z0000 Encounter for general adult medical examination without abnormal findings: Secondary | ICD-10-CM | POA: Diagnosis not present

## 2018-03-03 DIAGNOSIS — K219 Gastro-esophageal reflux disease without esophagitis: Secondary | ICD-10-CM | POA: Diagnosis not present

## 2018-03-03 DIAGNOSIS — Z131 Encounter for screening for diabetes mellitus: Secondary | ICD-10-CM | POA: Diagnosis not present

## 2018-03-03 DIAGNOSIS — Z125 Encounter for screening for malignant neoplasm of prostate: Secondary | ICD-10-CM | POA: Diagnosis not present

## 2018-09-21 IMAGING — MR MR HEAD W/O CM
10 series · 48 of 48 positions shown · non-contrast
Comparison: None.

CLINICAL DATA: Initial evaluation for acute vertigo, nausea,
vomiting.

EXAM:
MRI HEAD WITHOUT CONTRAST
TECHNIQUE: Multiplanar, multiecho pulse sequences of the brain and surrounding
structures were obtained without intravenous contrast.

[Series 3: DWI · axial · 3.0mm · 1.80mm/px · z∈[-34,+123]mm · 7 of 55 slices shown (1 of 2)]
[im 1/55]
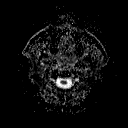
[im 10/55]
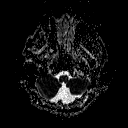
[im 19/55]
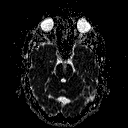
[im 28/55]
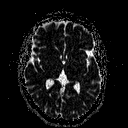
[im 37/55]
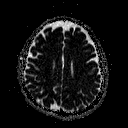
[im 46/55]
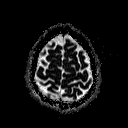
[im 55/55]
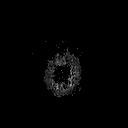

[Series 5: DWI · coronal · 3.0mm · 1.80mm/px · 6 of 51 slices shown (2 of 2)]
[im 1/51]
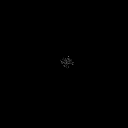
[im 11/51]
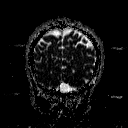
[im 21/51]
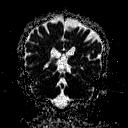
[im 31/51]
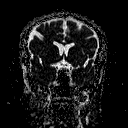
[im 41/51]
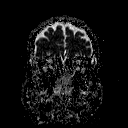
[im 51/51]
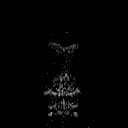

[Series 6: FLAIR · axial · 5.0mm · 0.45mm/px · z∈[-42,+122]mm · 3 of 27 slices shown]
[im 1/27]
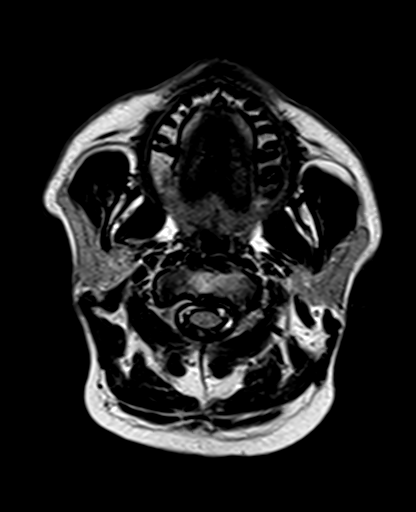
[im 14/27]
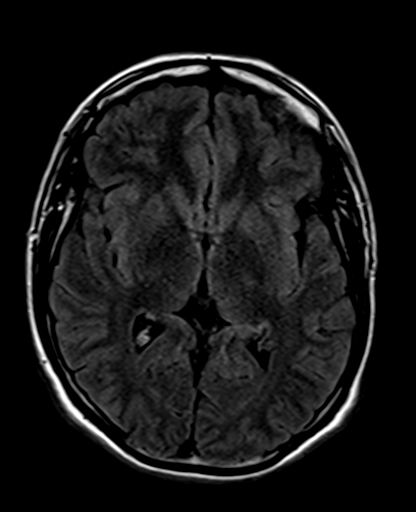
[im 27/27]
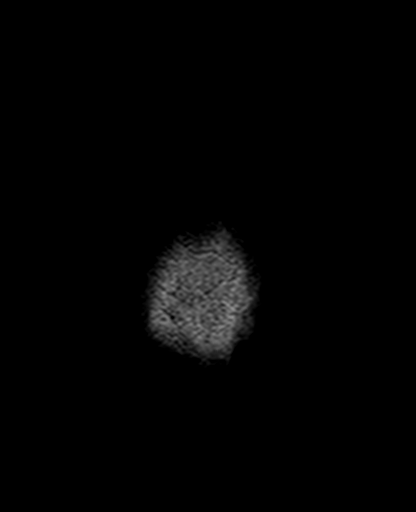

[Series 7: T2 · axial · 5.0mm · 0.45mm/px · z∈[-42,+122]mm · 3 of 27 slices shown (1 of 3)]
[im 1/27]
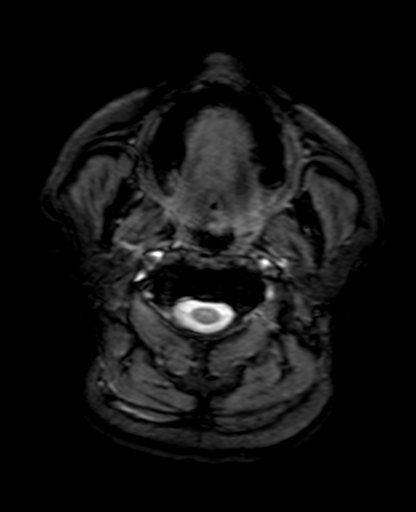
[im 14/27]
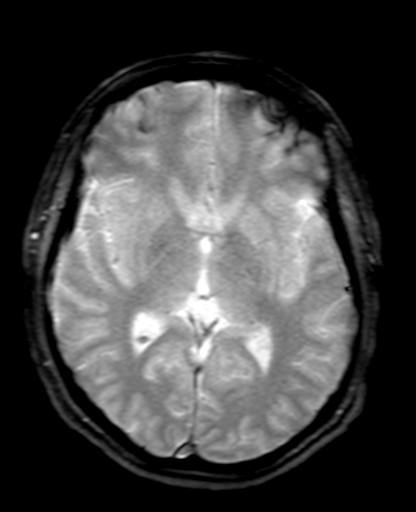
[im 27/27]
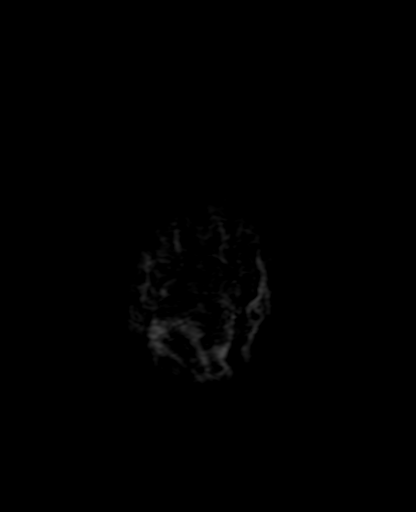

[Series 8: T2 · axial · 5.0mm · 0.90mm/px · z∈[-42,+122]mm · 3 of 27 slices shown (2 of 3)]
[im 1/27]
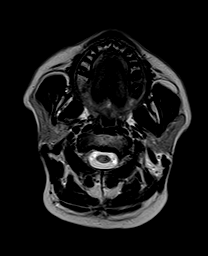
[im 14/27]
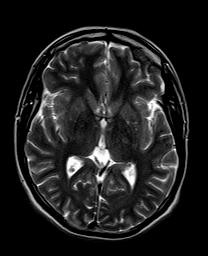
[im 27/27]
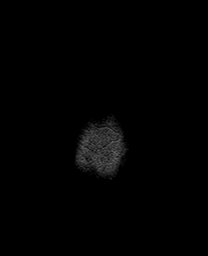

[Series 9: T1 · sagittal · 5.0mm · 0.45mm/px · 3 of 29 slices shown (1 of 2)]
[im 1/29]
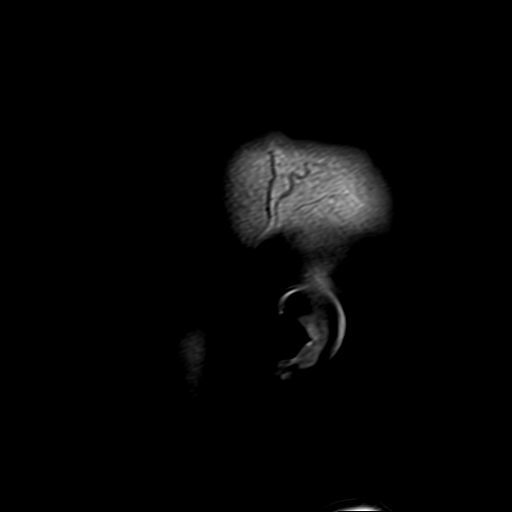
[im 15/29]
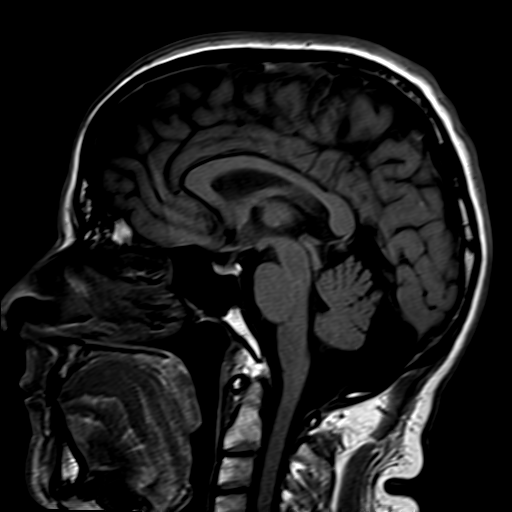
[im 29/29]
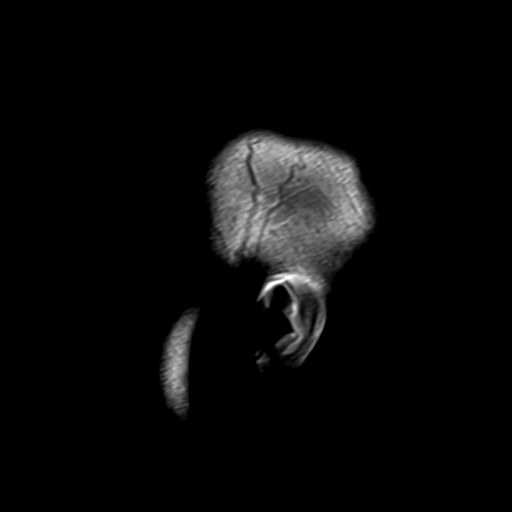

[Series 10: T1 · axial · 3.0mm · 1.00mm/px · z∈[-43,+129]mm · 7 of 60 slices shown (2 of 2)]
[im 1/60]
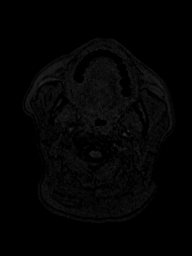
[im 10/60]
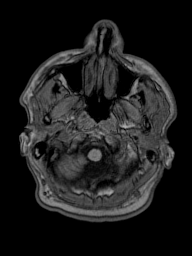
[im 20/60]
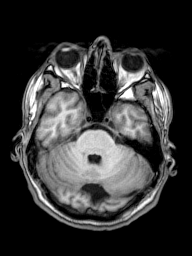
[im 30/60]
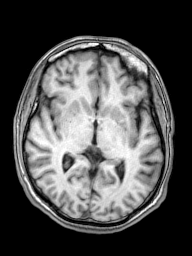
[im 40/60]
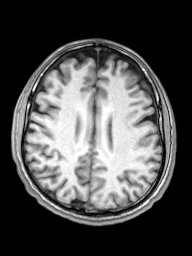
[im 50/60]
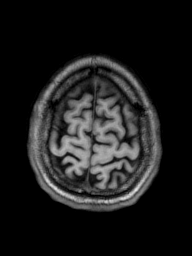
[im 60/60]
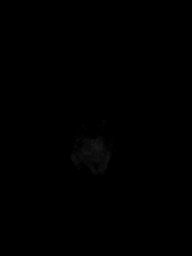

[Series 11: T2 · coronal · 5.0mm · 0.86mm/px · 4 of 31 slices shown (3 of 3)]
[im 1/31]
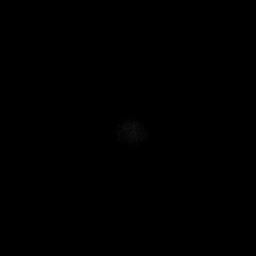
[im 11/31]
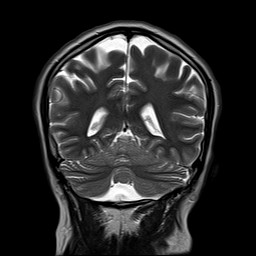
[im 21/31]
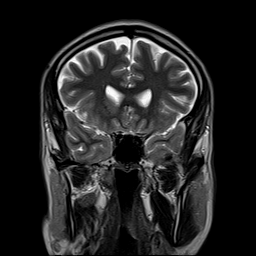
[im 31/31]
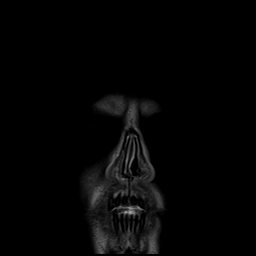

[Series 100: ax (id) · axial · 3.0mm · 1.80mm/px · z∈[-34,+123]mm · 6 of 55 slices shown]
[im 1/55]
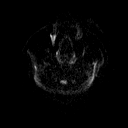
[im 11/55]
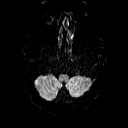
[im 22/55]
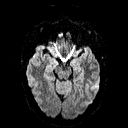
[im 33/55]
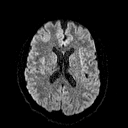
[im 44/55]
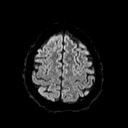
[im 55/55]
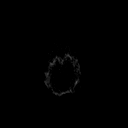

[Series 101: cor (id) · coronal · 3.0mm · 1.80mm/px · 6 of 50 slices shown]
[im 1/50]
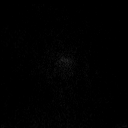
[im 10/50]
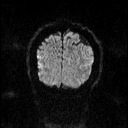
[im 20/50]
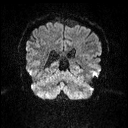
[im 30/50]
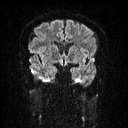
[im 40/50]
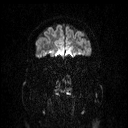
[im 50/50]
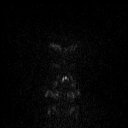

[48 of 48 positions shown; findings below may reference images not displayed]

FINDINGS: Brain: Cerebral volume within normal limits for patient age. No
focal parenchymal signal abnormality identified. No abnormal foci of
restricted diffusion to suggest acute or subacute ischemia.
Gray-white matter differentiation well maintained. No
encephalomalacia to suggest chronic infarction. No foci of
susceptibility artifact to suggest acute or chronic intracranial
hemorrhage.

No mass lesion, midline shift or mass effect. No hydrocephalus. No
extra-axial fluid collection. Major dural sinuses are grossly
patent. Retrocerebellar cystic lesion may reflect an arachnoid cyst
versus mega cisterna magna.

Pituitary gland and suprasellar region are normal. Midline
structures intact and normal.

Vascular: Major intracranial vascular flow voids well maintained and
normal in appearance.

Skull and upper cervical spine: Craniocervical junction normal.
Visualized upper cervical spine within normal limits. Bone marrow
signal intensity normal. No scalp soft tissue abnormality.

Sinuses/Orbits: Globes and orbital soft tissues within normal
limits. Paranasal sinuses are clear. No mastoid effusion. Inner ear
structures normal.

Other: None.
IMPRESSION: Normal brain MRI.  No acute intracranial abnormality identified.

## 2018-11-22 ENCOUNTER — Telehealth: Payer: Self-pay | Admitting: Internal Medicine

## 2018-11-22 NOTE — Telephone Encounter (Signed)
Tomorrow at 4 or Friday at 4:15

## 2018-11-22 NOTE — Telephone Encounter (Signed)
Best number [phone] Pt called to make appointment with you last time he was seen was 12/12/14 made appointment for 3/10 to re establish.  Pt wanted to know if he could be seen sooner he is having headaches (/cpx if possible)  Can pt be seen sooner

## 2018-11-22 NOTE — Telephone Encounter (Signed)
Appointment 3/5 °

## 2018-11-23 ENCOUNTER — Encounter: Payer: Self-pay | Admitting: Internal Medicine

## 2018-11-23 ENCOUNTER — Ambulatory Visit (INDEPENDENT_AMBULATORY_CARE_PROVIDER_SITE_OTHER): Payer: BLUE CROSS/BLUE SHIELD | Admitting: Internal Medicine

## 2018-11-23 VITALS — BP 118/72 | HR 78 | Temp 97.9°F | Wt 163.0 lb

## 2018-11-23 DIAGNOSIS — K219 Gastro-esophageal reflux disease without esophagitis: Secondary | ICD-10-CM | POA: Insufficient documentation

## 2018-11-23 DIAGNOSIS — R51 Headache: Secondary | ICD-10-CM

## 2018-11-23 DIAGNOSIS — R519 Headache, unspecified: Secondary | ICD-10-CM

## 2018-11-23 HISTORY — DX: Headache, unspecified: R51.9

## 2018-11-23 MED ORDER — OMEPRAZOLE 20 MG PO CPDR: 20.0000 mg | DELAYED_RELEASE_CAPSULE | Freq: Every day | ORAL | 3 refills | Status: DC

## 2018-11-23 MED ORDER — BUTALBITAL-APAP-CAFFEINE 50-325-40 MG PO TABS: 1.0000 | ORAL_TABLET | Freq: Four times a day (QID) | ORAL | 2 refills | Status: AC | PRN

## 2018-11-23 NOTE — Assessment & Plan Note (Signed)
Discussed avoiding spicy foods Start taking Omeprazole daily, RX sent to pharmacy

## 2018-11-23 NOTE — Assessment & Plan Note (Signed)
RX for Fioricet to take as needed for right now Encouraged him to stay well hydrated

## 2018-11-23 NOTE — Patient Instructions (Signed)
Dolor de cabeza general sin causa  General Headache Without Cause  El dolor de cabeza es un dolor o malestar que se siente en la zona de la cabeza o del cuello. Hay muchas causas y tipos de dolores de cabeza. En algunos casos, es posible que no se encuentre la causa.  Siga estas indicaciones en su casa:  Controle su afeccin para detectar cualquier cambio. Infrmele a su mdico acerca de los cambios. Siga estos pasos para controlar su afeccin:  Control del dolor          Tome los medicamentos de venta libre y los recetados solamente como se lo haya indicado el mdico.   Cuando sienta dolor de cabeza acustese en un cuarto oscuro y tranquilo.   Si se lo indican, aplquese hielo en la cabeza y en la zona del cuello:  ? Ponga el hielo en una bolsa plstica.  ? Coloque una toalla entre la piel y la bolsa.  ? Coloque el hielo durante 20minutos, 2a3veces al da.   Si se lo indican, aplique calor en la zona afectada. Use la fuente de calor que el mdico le recomiende, como una compresa de calor hmedo o una almohadilla trmica.  ? Coloque una toalla entre la piel y la fuente de calor.  ? Aplique calor durante 20 a 30minutos.  ? Retire la fuente de calor si la piel se pone de color rojo brillante. Esto es muy importante si no puede sentir dolor, calor o fro. Puede correr un riesgo mayor de sufrir quemaduras.   Mantenga las luces tenues si las luces brillantes le molestan o sus dolores de cabeza empeoran.  Comida y bebida   Mantenga un horario para las comidas.   Si bebe alcohol:  ? Limite la cantidad que bebe a lo siguiente:   De 0 a 1 medida por da para las mujeres.   De 0 a 2 medidas por da para los hombres.  ? Est atento a la cantidad de alcohol que hay en las bebidas que toma. En [place], una medida equivale a una botella de cerveza de 12oz (355ml), un vaso de vino de 5oz (148ml) o un vaso de una bebida alcohlica de alta graduacin de 1oz (44ml).   Deje de tomar cafena o reduzca  la cantidad que consume.  Indicaciones generales     Lleve un registro diario para averiguar si ciertas cosas provocan los dolores de cabeza. Registre, por ejemplo, lo siguiente:  ? Lo que usted come y bebe.  ? El tiempo que duerme.  ? Algn cambio en su dieta o en los medicamentos.   Hgase masajes o pruebe otras formas de relajarse.   Limite el estrs.   Sintese con la espalda recta. No contraiga (tensione) los msculos.   No consuma ningn producto que contenga nicotina o tabaco. Estos incluyen los cigarrillos, el tabaco para mascar y los cigarrillos electrnicos. Si necesita ayuda para dejar de fumar, consulte al mdico.   Haga ejercicios con regularidad tal como se lo indic el mdico.   Duerma lo suficiente. Esto a menudo significa entre 7 y 9horas de sueo cada noche.   Concurra a todas las visitas de control como se lo haya indicado el mdico. Esto es importante.  Comunquese con un mdico si:   Los medicamentos no logran aliviar los sntomas.   Tiene un dolor de cabeza que es diferente a los otros dolores de cabeza.   Tiene malestar estomacal (nuseas) o vomita.   Tiene fiebre.    Solicite ayuda inmediatamente si:   El dolor de cabeza empeora rpidamente.   El dolor empeora despus de hacer mucha actividad fsica.   Sigue vomitando.   Presenta rigidez en el cuello.   Tiene dificultad para ver.   Tiene dificultad para hablar.   Siente dolor en el ojo o en el odo.   Sus msculos estn dbiles, o pierde el control muscular.   Pierde el equilibrio o tiene problemas para caminar.   Siente que va a desvanecerse (perder el conocimiento) o se desmaya.   Est desorientado (confundido).   Tiene una convulsin.  Resumen   El dolor de cabeza es un dolor o malestar que se siente en la zona de la cabeza o del cuello.   Hay muchas causas y tipos de dolores de cabeza. En algunos casos, es posible que no se encuentre la causa.   Lleve un diario como ayuda para determinar la causa de los dolores  de cabeza. Controle su afeccin para detectar cualquier cambio. Infrmele a su mdico acerca de los cambios.   Comunquese con un mdico si tiene un dolor de cabeza que es diferente de lo habitual o si el dolor de cabeza no se alivia con los medicamentos.   Solicite ayuda de inmediato si el dolor de cabeza es muy intenso, vomita, tiene dificultad para ver, pierde el equilibrio o tiene una convulsin.  Esta informacin no tiene como fin reemplazar el consejo del mdico. Asegrese de hacerle al mdico cualquier pregunta que tenga.  Document Released: 11/29/2011 Document Revised: 05/10/2018 Document Reviewed: 05/10/2018  [employer] Interactive Patient Education  2019 [employer]

## 2018-11-23 NOTE — Progress Notes (Signed)
HPI  Pt presents to the clinic today to reestablish care and for management of the conditions listed below.  GERD: Triggered by spicy foods. He takes Omeprazole daily as needed with good relief. He has never had an upper GI.  Frequent Heaches: These have been occurring daily for 2-3 weeks. Located in the temples. He describes the pain as throbbing or pulsating. He denies dizzinesss, sensitivity ot light or sound. He has had some nausea and vomiting. He denies neck pain or increased stress. He is taking Ibuprofen and Tylenol without any relief.   Flu: 07/2018 Tetanus: ? 2018 Vision Screening: as needed Dentist: biannually  Past Medical History:  Diagnosis Date  . Frequent headaches   . GERD (gastroesophageal reflux disease)     Current Outpatient Medications  Medication Sig Dispense Refill  . acetaminophen (TYLENOL) 500 MG tablet Take 500 mg by mouth every 6 (six) hours as needed for headache.    . ibuprofen (ADVIL,MOTRIN) 200 MG tablet Take 200 mg by mouth every 6 (six) hours as needed.     No current facility-administered medications for this visit.     No Known Allergies  Family History  Problem Relation Age of Onset  . Diabetes Maternal Aunt   . Diabetes Maternal Uncle   . Cancer Neg Hx   . Stroke Neg Hx   . Heart disease Neg Hx     Social History   Socioeconomic History  . Marital status: Married    Spouse name: Not on file  . Number of children: Not on file  . Years of education: Not on file  . Highest education level: Not on file  Occupational History  . Not on file  Social Needs  . Financial resource strain: Not on file  . Food insecurity:    Worry: Not on file    Inability: Not on file  . Transportation needs:    Medical: Not on file    Non-medical: Not on file  Tobacco Use  . Smoking status: Current Some Day Smoker    Types: Cigarettes  . Smokeless tobacco: Never Used  Substance and Sexual Activity  . Alcohol use: Yes    Alcohol/week: 12.0  standard drinks    Types: 12 Cans of beer per week    Comment: occasional  . Drug use: No  . Sexual activity: Yes  Lifestyle  . Physical activity:    Days per week: Not on file    Minutes per session: Not on file  . Stress: Not on file  Relationships  . Social connections:    Talks on phone: Not on file    Gets together: Not on file    Attends religious service: Not on file    Active member of club or organization: Not on file    Attends meetings of clubs or organizations: Not on file    Relationship status: Not on file  . Intimate partner violence:    Fear of current or ex partner: Not on file    Emotionally abused: Not on file    Physically abused: Not on file    Forced sexual activity: Not on file  Other Topics Concern  . Not on file  Social History Narrative  . Not on file    ROS:  Constitutional: Pt reports frequent headaches. Denies fever, malaise, fatigue,  or abrupt weight changes.  HEENT: Pt reports nasal congestion. Denies eye pain, eye redness, ear pain, ringing in the ears, wax buildup, runny nose, bloody nose, or sore  throat. Respiratory: Denies difficulty breathing, shortness of breath, cough or sputum production.   Cardiovascular: Denies chest pain, chest tightness, palpitations or swelling in the hands or feet.  Gastrointestinal: Pt reports intermittent reflux. Denies abdominal pain, bloating, constipation, diarrhea or blood in the stool.  GU: Denies frequency, urgency, pain with urination, blood in urine, odor or discharge. Musculoskeletal: Denies decrease in range of motion, difficulty with gait, muscle pain or joint pain and swelling.  Skin: Denies redness, rashes, lesions or ulcercations.  Neurological: Denies dizziness, difficulty with memory, difficulty with speech or problems with balance and coordination.  Psych: Denies anxiety, depression, SI/HI.  No other specific complaints in a complete review of systems (except as listed in HPI  above).  PE:  BP 118/72   Pulse 78   Temp 97.9 F (36.6 C) (Oral)   Wt 163 lb (73.9 kg)   SpO2 98%   BMI 27.12 kg/m   Wt Readings from Last 3 Encounters:  11/23/18 163 lb (73.9 kg)  09/19/17 155 lb (70.3 kg)  12/12/14 159 lb 12.8 oz (72.5 kg)    General: Appears his stated age, well developed, well nourished in NAD. HEENT: Head: normal shape and size; Eyes: sclera white, no icterus, conjunctiva pink, PERRLA and EOMs intact; Ears: Tm's gray and intact, normal light reflex;Throat/Mouth: Teeth present, + PND, mucosa pink and moist, no lesions or ulcerations noted.  Neck: Neck supple, trachea midline. No masses, lumps or thyromegaly present.  Cardiovascular: Normal rate and rhythm.  Pulmonary/Chest: Normal effort and positive vesicular breath sounds. No respiratory distress. No wheezes, rales or ronchi noted.  Abdomen: Soft and nontender. Normal bowel sounds. Neurological: Alert and oriented.Coordination normal.  Psychiatric: Mood and affect normal. Behavior is normal. Judgment and thought content normal.     BMET    Component Value Date/Time   NA 140 09/19/2017 1208   K 3.9 09/19/2017 1208   CL 106 09/19/2017 1208   CO2 25 09/19/2017 1208   GLUCOSE 124 (H) 09/19/2017 1208   BUN 15 09/19/2017 1208   CREATININE 0.70 09/19/2017 1208   CALCIUM 9.5 09/19/2017 1208   GFRNONAA >60 09/19/2017 1208   GFRAA >60 09/19/2017 1208    Lipid Panel     Component Value Date/Time   CHOL 191 01/15/2014 1404   TRIG 272.0 (H) 01/15/2014 1404   HDL 41.90 01/15/2014 1404   CHOLHDL 5 01/15/2014 1404   VLDL 54.4 (H) 01/15/2014 1404   LDLCALC 95 01/15/2014 1404    CBC    Component Value Date/Time   WBC 5.2 09/19/2017 1208   RBC 5.21 09/19/2017 1208   HGB 15.7 09/19/2017 1208   HCT 46.1 09/19/2017 1208   PLT 164 09/19/2017 1208   MCV 88.4 09/19/2017 1208   MCH 30.1 09/19/2017 1208   MCHC 34.0 09/19/2017 1208   RDW 13.0 09/19/2017 1208    Hgb A1C No results found for:  HGBA1C   Assessment and Plan:

## 2018-11-28 ENCOUNTER — Ambulatory Visit: Payer: Self-pay | Admitting: Internal Medicine

## 2019-03-05 ENCOUNTER — Encounter: Payer: Self-pay | Admitting: Internal Medicine

## 2019-08-03 DIAGNOSIS — H5789 Other specified disorders of eye and adnexa: Secondary | ICD-10-CM | POA: Diagnosis not present

## 2020-01-28 ENCOUNTER — Ambulatory Visit: Payer: BC Managed Care – PPO | Admitting: Internal Medicine

## 2020-01-28 ENCOUNTER — Encounter: Payer: Self-pay | Admitting: Internal Medicine

## 2020-01-28 ENCOUNTER — Other Ambulatory Visit: Payer: Self-pay

## 2020-01-28 VITALS — BP 112/70 | HR 67 | Temp 97.3°F | Ht 65.0 in | Wt 171.8 lb

## 2020-01-28 DIAGNOSIS — Z1211 Encounter for screening for malignant neoplasm of colon: Secondary | ICD-10-CM | POA: Diagnosis not present

## 2020-01-28 DIAGNOSIS — M79601 Pain in right arm: Secondary | ICD-10-CM

## 2020-01-28 DIAGNOSIS — R0789 Other chest pain: Secondary | ICD-10-CM | POA: Diagnosis not present

## 2020-01-28 DIAGNOSIS — Z0000 Encounter for general adult medical examination without abnormal findings: Secondary | ICD-10-CM | POA: Diagnosis not present

## 2020-01-28 DIAGNOSIS — R519 Headache, unspecified: Secondary | ICD-10-CM | POA: Diagnosis not present

## 2020-01-28 DIAGNOSIS — K219 Gastro-esophageal reflux disease without esophagitis: Secondary | ICD-10-CM | POA: Diagnosis not present

## 2020-01-28 DIAGNOSIS — Z114 Encounter for screening for human immunodeficiency virus [HIV]: Secondary | ICD-10-CM

## 2020-01-28 DIAGNOSIS — M79602 Pain in left arm: Secondary | ICD-10-CM

## 2020-01-28 DIAGNOSIS — F17200 Nicotine dependence, unspecified, uncomplicated: Secondary | ICD-10-CM

## 2020-01-28 LAB — COMPREHENSIVE METABOLIC PANEL
ALT: 32 U/L (ref 0–53)
AST: 22 U/L (ref 0–37)
Albumin: 4.4 g/dL (ref 3.5–5.2)
Alkaline Phosphatase: 84 U/L (ref 39–117)
BUN: 15 mg/dL (ref 6–23)
CO2: 27 mEq/L (ref 19–32)
Calcium: 9.1 mg/dL (ref 8.4–10.5)
Chloride: 103 mEq/L (ref 96–112)
Creatinine, Ser: 0.82 mg/dL (ref 0.40–1.50)
GFR: 100.77 mL/min (ref >60.00)
Glucose, Bld: 119 mg/dL — ABNORMAL HIGH (ref 70–99)
Potassium: 4 mEq/L (ref 3.5–5.1)
Sodium: 136 mEq/L (ref 135–145)
Total Bilirubin: 0.4 mg/dL (ref 0.2–1.2)
Total Protein: 7.3 g/dL (ref 6.0–8.3)

## 2020-01-28 LAB — LIPID PANEL
Cholesterol: 203 mg/dL — ABNORMAL HIGH (ref 0–200)
HDL: 40.5 mg/dL (ref >39.00)
LDL Cholesterol: 123 mg/dL — ABNORMAL HIGH (ref 0–99)
NonHDL: 162.18
Total CHOL/HDL Ratio: 5
Triglycerides: 196 mg/dL — ABNORMAL HIGH (ref 0.0–149.0)
VLDL: 39.2 mg/dL (ref 0.0–40.0)

## 2020-01-28 LAB — CBC
HCT: 43.5 % (ref 39.0–52.0)
Hemoglobin: 14.9 g/dL (ref 13.0–17.0)
MCHC: 34.3 g/dL (ref 30.0–36.0)
MCV: 89.2 fl (ref 78.0–100.0)
Platelets: 177 10*3/uL (ref 150.0–400.0)
RBC: 4.88 Mil/uL (ref 4.22–5.81)
RDW: 12.9 % (ref 11.5–15.5)
WBC: 3.6 10*3/uL — ABNORMAL LOW (ref 4.0–10.5)

## 2020-01-28 MED ORDER — BUTALBITAL-APAP-CAFFEINE 50-325-40 MG PO TABS: 1.0000 | ORAL_TABLET | Freq: Four times a day (QID) | ORAL | 5 refills | Status: AC | PRN

## 2020-01-28 MED ORDER — NICOTINE 14 MG/24HR TD PT24: 14.0000 mg | MEDICATED_PATCH | Freq: Every day | TRANSDERMAL | 0 refills | Status: DC

## 2020-01-28 MED ORDER — OMEPRAZOLE 20 MG PO CPDR: 20.0000 mg | DELAYED_RELEASE_CAPSULE | Freq: Every day | ORAL | 3 refills | Status: DC

## 2020-01-28 NOTE — Patient Instructions (Signed)
Colonoscopia en los adultos Colonoscopy, Adult [place] colonoscopa es un examen que se realiza para examinar el intestino grueso. Se realiza [employer] de un tubo [place], fino y flexible que tiene una cmara en el extremo. Este examen se realiza para detectar si hay problemas, por ejemplo:  Bultos (tumores).  Crecimientos (plipos).  Irritacin e hinchazn(inflamacin).  Sangrado. Consulte al mdico acerca de lo siguiente:  Cualquier alergia que tenga.  Todos los [employer] toma. Infrmele sobre vitaminas, hierbas, gotas oftlmicas, cremas y medicamentos de venta libre.  Cualquier problema previo que usted o algn miembro de su familia hayan tenido con los anestsicos.  Cualquier trastorno de la sangre que tenga.  Cirugas a las que se haya sometido.  Cualquier afeccin mdica que tenga.  Si est embarazada o podra estarlo.  Cualquier problema que haya tenido al defecar ( deposiciones). Cules son los riesgos? En general, se trata de un procedimiento seguro. Sin embargo, pueden presentarse problemas, por ejemplo:  Sangrado.  Dao intestinal.  Reacciones alrgicas a los medicamentos administrados durante el procedimiento.  Infeccin. Esto es poco frecuente. Qu ocurre antes del procedimiento? Comida y bebida Siga las indicaciones del mdico respecto de las comidas y las bebidas. Estas incluyen:  [employer] del procedimiento: ? Siga una dieta con bajo contenido de [place]. ? Evite estos alimentos:  Frutos secos.  Semillas.  Frutas pasas.  Frutas crudas.  Vegetales.  Nowata 1 y 3 das antes del procedimiento: ? Coma solo postre de gelatina o helados de [place]. ? Beba solamente lquidos transparentes, por ejemplo:  [place].  Caldos o sopas transparentes.  Caf negro o t.  Jugos transparentes.  Refrescos o bebidas deportivas transparentes. ? No beba lquidos con colorante rojo o morado.  El da del procedimiento: ? No coma alimentos slidos.  Puede continuar bebiendo lquidos claros hasta 2 horas antes del procedimiento. ? No coma ni beba nada a partir de 2 horas antes al procedimiento o segn le haya indicado el mdico. Preparado intestinal Si le recetaron un preparado intestinal para tomar por la boca (por va oral) para limpiar el colon:  Tmelo como se lo haya indicado el mdico. A partir del da anterior al procedimiento, tendr que beber una gran cantidad de un medicamento lquido. El lquido lo har defecar hasta que la materia fecal sea casi transparente o de color verde claro.  Si la piel o la zona anal se le irritan debido a Building services engineer, Fish farm manager lo siguiente: ? Limpie la zona con toallitas que contengan productos medicinales, por ejemplo, toallitas hmedas para adultos con aloe y vitaminaE. ? Aplquese un producto en la piel que suavice la zona, como vaselina.  Si vomita mientras toma el preparado intestinal: ? Lorayne Marek pausa durante 60 minutos como mximo. ? Comience a tomar el preparado nuevamente. ? Llame al mdico si sigue vomitando y no puede tomar el preparado intestinal sin vomitar.  Para limpiarle el colon, tambin pueden darle: ? Medicamentos laxantes. Estos ayudan a defecar. ? Instrucciones para el uso de un medicamento lquido (enema) inyectado en el ano. Medicamentos Consulte al mdico si debe hacer o no lo siguiente:  Quarry manager o suspender los medicamentos que recibe normalmente. Esto es importante.  Tomar aspirina e ibuprofeno. No tome estos medicamentos a menos que el mdico se lo indique.  Tomar medicamentos de [employer], vitaminas, hierbas y suplementos. Instrucciones generales  Pregntele al mdico qu medidas se tomarn para evitar la propagacin de grmenes. Estas pueden incluir lavar la piel con un jabn  antisptico.  Prepare que alguien lo lleve a su casa desde el hospital o la clnica. Qu ocurre durante el procedimiento?   Pueden colocarle un tubo (catter) intravenoso en una de  las venas.  Pueden administrarle uno o ms de los siguientes medicamentos: ? Un medicamento para ayudar a relajarse (sedante). ? Un medicamento para adormecer la zona (anestesia local). ? Un medicamento que lo har dormir (anestesia general). Esto debe realizarse en contadas ocasiones.  Se recostar de costado con las rodillas flexionadas.  El tubo: ? Estar recubierto de aceite o gel. ? Se colocar en la abertura del ano. ? Se introducir suavemente en el intestino grueso.  Le aplicarn aire en el colon para mantenerlo abierto. Es posible que sienta presin o clicos.  La cmara se utilizar para tomar fotos que Insurance underwriter.  Podrn tomarle una pequea muestra de tejido (biopsia) para examinarla.  Si se encuentran pequeos crecimientos, el mdico puede extirparlos y analizarlos para Landscape architect presencia de cncer.  El tubo se extraer lentamente. Este procedimiento puede variar segn el mdico y el hospital. Ladell Heads ocurre despus del procedimiento?  Lo controlarn hasta que deje el hospital o la [place]. Esto requiere el control de: ? Presin arterial. ? Frecuencia cardaca. ? Frecuencia respiratoria. ? Nivel de oxgeno en la sangre.  Es posible que encuentre una pequea cantidad de sangre en la materia fecal.  Puede eliminar gases.  Puede sentir clicos leves o distensin en el abdomen.  No conduzca durante 24horas despus del procedimiento.  Es su responsabilidad retirar [employer] del procedimiento. Pregntele al mdico o consulte en el departamento que Sales executive procedimiento cundo [employer]. Resumen  [place] colonoscopa es un examen que se realiza para examinar el intestino grueso.  Siga las instrucciones de su mdico acerca de comer y beber antes del procedimiento.  Es posible que le receten un preparado intestinal por va oral para limpiar el colon. Tmelo como se lo haya indicado el mdico.  Se introducir un tubo flexible con  una cmara en su extremo en el orificio del ano. Se introducir en el intestino grueso.  Lo controlarn hasta que deje el hospital o la [place]. Esta informacin no tiene Theme park manager el consejo del mdico. Asegrese de hacerle al mdico cualquier pregunta que tenga. Document Revised: 05/23/2019 Document Reviewed: 05/23/2019 Elsevier Patient Education  2020 [employer].

## 2020-01-28 NOTE — Assessment & Plan Note (Signed)
Avoid foods that trigger reflux Omeprazole refilled today CBC and CMET today

## 2020-01-28 NOTE — Assessment & Plan Note (Signed)
Fioricet refilled today
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
## 2020-01-29 LAB — HIV ANTIBODY (ROUTINE TESTING W REFLEX): HIV 1&2 Ab, 4th Generation: NONREACTIVE

## 2020-10-03 DIAGNOSIS — Z03818 Encounter for observation for suspected exposure to other biological agents ruled out: Secondary | ICD-10-CM | POA: Diagnosis not present

## 2021-02-12 DIAGNOSIS — R059 Cough, unspecified: Secondary | ICD-10-CM | POA: Diagnosis not present

## 2021-02-12 DIAGNOSIS — R109 Unspecified abdominal pain: Secondary | ICD-10-CM | POA: Diagnosis not present

## 2021-02-12 DIAGNOSIS — Z03818 Encounter for observation for suspected exposure to other biological agents ruled out: Secondary | ICD-10-CM | POA: Diagnosis not present

## 2021-02-12 DIAGNOSIS — R509 Fever, unspecified: Secondary | ICD-10-CM | POA: Diagnosis not present

## 2021-02-12 DIAGNOSIS — R1032 Left lower quadrant pain: Secondary | ICD-10-CM | POA: Diagnosis not present

## 2021-02-17 DIAGNOSIS — R1032 Left lower quadrant pain: Secondary | ICD-10-CM | POA: Diagnosis not present

## 2021-02-17 DIAGNOSIS — R109 Unspecified abdominal pain: Secondary | ICD-10-CM | POA: Diagnosis not present

## 2021-02-17 DIAGNOSIS — M545 Low back pain, unspecified: Secondary | ICD-10-CM | POA: Diagnosis not present

## 2021-02-17 DIAGNOSIS — R059 Cough, unspecified: Secondary | ICD-10-CM | POA: Diagnosis not present

## 2021-02-17 DIAGNOSIS — U071 COVID-19: Secondary | ICD-10-CM | POA: Diagnosis not present

## 2021-02-27 DIAGNOSIS — Z20822 Contact with and (suspected) exposure to covid-19: Secondary | ICD-10-CM | POA: Diagnosis not present

## 2021-08-21 ENCOUNTER — Ambulatory Visit: Payer: BC Managed Care – PPO | Admitting: Family

## 2021-08-21 ENCOUNTER — Other Ambulatory Visit: Payer: Self-pay

## 2021-08-21 ENCOUNTER — Encounter: Payer: Self-pay | Admitting: Family

## 2021-08-21 VITALS — BP 116/84 | HR 75 | Temp 97.7°F | Ht 65.0 in | Wt 167.0 lb

## 2021-08-21 DIAGNOSIS — Z1211 Encounter for screening for malignant neoplasm of colon: Secondary | ICD-10-CM | POA: Diagnosis not present

## 2021-08-21 DIAGNOSIS — Z125 Encounter for screening for malignant neoplasm of prostate: Secondary | ICD-10-CM | POA: Diagnosis not present

## 2021-08-21 DIAGNOSIS — R739 Hyperglycemia, unspecified: Secondary | ICD-10-CM | POA: Diagnosis not present

## 2021-08-21 DIAGNOSIS — R6882 Decreased libido: Secondary | ICD-10-CM

## 2021-08-21 DIAGNOSIS — M792 Neuralgia and neuritis, unspecified: Secondary | ICD-10-CM | POA: Diagnosis not present

## 2021-08-21 DIAGNOSIS — R3915 Urgency of urination: Secondary | ICD-10-CM | POA: Diagnosis not present

## 2021-08-21 DIAGNOSIS — K5900 Constipation, unspecified: Secondary | ICD-10-CM | POA: Insufficient documentation

## 2021-08-21 DIAGNOSIS — E785 Hyperlipidemia, unspecified: Secondary | ICD-10-CM

## 2021-08-21 DIAGNOSIS — Z23 Encounter for immunization: Secondary | ICD-10-CM

## 2021-08-21 DIAGNOSIS — K219 Gastro-esophageal reflux disease without esophagitis: Secondary | ICD-10-CM | POA: Diagnosis not present

## 2021-08-21 DIAGNOSIS — Z0001 Encounter for general adult medical examination with abnormal findings: Secondary | ICD-10-CM | POA: Diagnosis not present

## 2021-08-21 LAB — COMPREHENSIVE METABOLIC PANEL
ALT: 49 U/L (ref 0–53)
AST: 28 U/L (ref 0–37)
Albumin: 4.5 g/dL (ref 3.5–5.2)
Alkaline Phosphatase: 81 U/L (ref 39–117)
BUN: 17 mg/dL (ref 6–23)
CO2: 26 mEq/L (ref 19–32)
Calcium: 9.6 mg/dL (ref 8.4–10.5)
Chloride: 103 mEq/L (ref 96–112)
Creatinine, Ser: 0.84 mg/dL (ref 0.40–1.50)
GFR: 103.19 mL/min (ref >60.00)
Glucose, Bld: 105 mg/dL — ABNORMAL HIGH (ref 70–99)
Potassium: 3.7 mEq/L (ref 3.5–5.1)
Sodium: 137 mEq/L (ref 135–145)
Total Bilirubin: 0.4 mg/dL (ref 0.2–1.2)
Total Protein: 7.5 g/dL (ref 6.0–8.3)

## 2021-08-21 LAB — CBC WITH DIFFERENTIAL/PLATELET
Basophils Absolute: 0 10*3/uL (ref 0.0–0.1)
Basophils Relative: 0.8 % (ref 0.0–3.0)
Eosinophils Absolute: 0.1 10*3/uL (ref 0.0–0.7)
Eosinophils Relative: 2.6 % (ref 0.0–5.0)
HCT: 44.9 % (ref 39.0–52.0)
Hemoglobin: 15.1 g/dL (ref 13.0–17.0)
Lymphocytes Relative: 29.1 % (ref 12.0–46.0)
Lymphs Abs: 1.3 10*3/uL (ref 0.7–4.0)
MCHC: 33.7 g/dL (ref 30.0–36.0)
MCV: 87.8 fl (ref 78.0–100.0)
Monocytes Absolute: 0.4 10*3/uL (ref 0.1–1.0)
Monocytes Relative: 9.4 % (ref 3.0–12.0)
Neutro Abs: 2.6 10*3/uL (ref 1.4–7.7)
Neutrophils Relative %: 58.1 % (ref 43.0–77.0)
Platelets: 192 10*3/uL (ref 150.0–400.0)
RBC: 5.12 Mil/uL (ref 4.22–5.81)
RDW: 13 % (ref 11.5–15.5)
WBC: 4.5 10*3/uL (ref 4.0–10.5)

## 2021-08-21 LAB — LIPID PANEL
Cholesterol: 231 mg/dL — ABNORMAL HIGH (ref 0–200)
HDL: 44.2 mg/dL (ref >39.00)
NonHDL: 186.4
Total CHOL/HDL Ratio: 5
Triglycerides: 246 mg/dL — ABNORMAL HIGH (ref 0.0–149.0)
VLDL: 49.2 mg/dL — ABNORMAL HIGH (ref 0.0–40.0)

## 2021-08-21 LAB — URINALYSIS, ROUTINE W REFLEX MICROSCOPIC
Bilirubin Urine: NEGATIVE
Hgb urine dipstick: NEGATIVE
Ketones, ur: NEGATIVE
Leukocytes,Ua: NEGATIVE
Nitrite: NEGATIVE
RBC / HPF: NONE SEEN (ref 0–?)
Specific Gravity, Urine: >=1.03 — AB (ref 1.000–1.030)
Total Protein, Urine: NEGATIVE
Urine Glucose: NEGATIVE
Urobilinogen, UA: 0.2 (ref 0.0–1.0)
pH: 6 (ref 5.0–8.0)

## 2021-08-21 LAB — TSH: TSH: 0.92 u[IU]/mL (ref 0.35–5.50)

## 2021-08-21 LAB — B12 AND FOLATE PANEL
Folate: 14.4 ng/mL (ref >5.9)
Vitamin B-12: 325 pg/mL (ref 211–911)

## 2021-08-21 LAB — LDL CHOLESTEROL, DIRECT: Direct LDL: 175 mg/dL

## 2021-08-21 LAB — HEMOGLOBIN A1C: Hgb A1c MFr Bld: 6.1 % (ref 4.6–6.5)

## 2021-08-21 MED ORDER — HYDROCORTISONE ACETATE 25 MG RE SUPP: 25.0000 mg | Freq: Two times a day (BID) | RECTAL | 0 refills | Status: AC | PRN

## 2021-08-21 MED ORDER — OMEPRAZOLE 20 MG PO CPDR: 20.0000 mg | DELAYED_RELEASE_CAPSULE | Freq: Every day | ORAL | 3 refills | Status: DC

## 2021-08-21 NOTE — Assessment & Plan Note (Signed)
Discussed use of over-the-counter stool softener as well as daily MiraLAX to see if there is any improvement in constipation symptoms.  Patient also referred to GI for colonoscopy as well as further evaluation of rectal burning.

## 2021-08-21 NOTE — Assessment & Plan Note (Signed)
Work on a low-cholesterol diet and exercise as tolerated we will obtain lipid panel pending results.

## 2021-08-21 NOTE — Assessment & Plan Note (Signed)
Refilled omeprazole. Pt instructed to decrease spicy, fried fatty foods as well as decreased chocolate and caffeine. Instructed to f/u if no improvement of symptoms.

## 2021-08-21 NOTE — Patient Instructions (Addendum)
For Constipation I recommend that you start taking MiraLAX once daily as well as a stool softener that you can find over-the-counter -such as Colace. Work on Metallurgist in [employer] as well   A referral was placed today for your colonscopy as well as referral to GI. Please let us know if you have not heard back within 1 week about your referral.   Stop by the lab prior to leaving today. I will notify you of your results once received.   Recommendations on keeping yourself healthy:  - Exercise at least 30-45 minutes a day, 3-4 days a week.  - Eat a low-fat diet with lots of fruits and vegetables, up to 7-9 servings per day.  - Seatbelts can save your life. Wear them always.  - Smoke detectors on every level of your home, check batteries every year.  - Eye Doctor - have an eye exam every 1-2 years  - Safe sex - if you may be exposed to STDs, use a condom.  - Alcohol -  If you drink, do it moderately, less than 2 drinks per day.  - Health Care Power of Attorney. Choose someone to speak for you if you are not able.  - Depression is common in our stressful world.If you're feeling down or losing interest in things you normally enjoy, please come in for a visit.  - Violence - If anyone is threatening or hurting you, please call immediately.  Due to recent changes in healthcare laws, you may see results of your imaging and/or laboratory studies on MyChart before I have had a chance to review them.  I understand that in some cases there may be results that are confusing or concerning to you. Please understand that not all results are received at the same time and often I may need to interpret multiple results in order to provide you with the best plan of care or course of treatment. Therefore, I ask that you please give me 2 business days to thoroughly review all your results before contacting my office for clarification. Should we see a critical lab result, you will be contacted sooner.   I will  see you again in one year for your annual comprehensive exam unless otherwise stated and or with acute concerns.  It was a pleasure seeing you today! Please do not hesitate to reach out with any questions and or concerns.  Regards,   Mort Sawyers

## 2021-08-21 NOTE — Assessment & Plan Note (Signed)
Testosterone ordered pending results.  If libido still affected and testosterone within normal limits and/or low will consider referral to urology.

## 2021-08-21 NOTE — Progress Notes (Signed)
Established Patient Office Visit  Subjective:  Patient ID: Alejandro Barr, male    DOB: [birth year]  Age: 50 y.o. MRN: [id number]  CC:  Chief Complaint  Patient presents with   Transitions Of Care   Rectal Pain    Had a prostate infection 3 yrs ago, states it burns constantly.     HPI Alejandro Barr is here today for transfer of care.   GERD: was on omeprazole, ran out, return GERD symptoms. Lots of heartburns.   Burning tingling bil lower feet, bottom of feet feel warm. Has been for a few months. Denies pain with walking relieved by rest.   Pt states he can pee, but he states only about every four hours or so. Drinks about 6-7 bottles daily he feels as though he should be going more often. No dysuria. No nocturia, no frequency. Does have some urinary urgency at times but not to the point of incontinence.  Bowel movements- suffers from burning on the rectal area. No burning in the stool. He doesn't feel pressure of his rectum and or pain. Constipation, sometimes not going for at least two days. No rash. Sensation is constant, not aggravated by movement. Started about one month ago.   Decreased sexual libido, married 18 years. Denies difficulty with her erection. No risk of std per pt, he states that monogamous with wife and feels she is as well.   Past Medical History:  Diagnosis Date   Frequent headaches    Frequent headaches 11/23/2018   GERD (gastroesophageal reflux disease)     No past surgical history on file.  Family History  Problem Relation Age of Onset   Diabetes Maternal Aunt    Diabetes Maternal Uncle    Cancer Neg Hx    Stroke Neg Hx    Heart disease Neg Hx     Social History   Socioeconomic History   Marital status: Married    Spouse name: Not on file   Number of children: Not on file   Years of education: Not on file   Highest education level: Not on file  Occupational History    Employer: [employer]   Occupation: Lobbyist  Tobacco Use    Smoking status: Former    Types: Cigarettes    Quit date: 03/10/2021    Years since quitting: 0.4   Smokeless tobacco: Never  Vaping Use   Vaping Use: Every day   Substances: Nicotine  Substance and Sexual Activity   Alcohol use: Yes    Alcohol/week: 12.0 standard drinks    Types: 12 Cans of beer per week    Comment: occasional   Drug use: No   Sexual activity: Yes  Other Topics Concern   Not on file  Social History Narrative   Not on file   Social Determinants of Health   Financial Resource Strain: Not on file  Food Insecurity: Not on file  Transportation Needs: Not on file  Physical Activity: Not on file  Stress: Not on file  Social Connections: Not on file  Intimate Partner Violence: Not on file    Outpatient Medications Prior to Visit  Medication Sig Dispense Refill   acetaminophen (TYLENOL) 500 MG tablet Take 500 mg by mouth every 6 (six) hours as needed for headache.     ibuprofen (ADVIL,MOTRIN) 200 MG tablet Take 200 mg by mouth every 6 (six) hours as needed.     Loratadine 10 MG CAPS Take by mouth.     nicotine (NICODERM CQ) 14  mg/24hr patch Place 1 patch (14 mg total) onto the skin daily. 28 patch 0   omeprazole (PRILOSEC) 20 MG capsule Take 1 capsule (20 mg total) by mouth daily. 90 capsule 3   No facility-administered medications prior to visit.    No Known Allergies  ROS Review of Systems  Constitutional:  Negative for chills and fever.  Respiratory:  Negative for cough and shortness of breath.   Cardiovascular:  Negative for chest pain and palpitations.  Gastrointestinal:  Positive for constipation and rectal pain (burning sensation for a few months). Negative for abdominal pain, blood in stool, diarrhea (every two days or so has normal bowel movement uses miralax at times) and nausea.  Genitourinary:  Positive for urgency. Negative for difficulty urinating, flank pain, frequency, penile discharge, penile pain, scrotal swelling and testicular pain.      Objective:    Physical Exam Vitals reviewed.  Constitutional:      General: He is not in acute distress.    Appearance: Normal appearance. He is normal weight. He is not ill-appearing or toxic-appearing.  HENT:     Right Ear: Tympanic membrane normal.     Left Ear: Tympanic membrane normal.     Nose: Nose normal.     Mouth/Throat:     Mouth: Mucous membranes are moist.  Eyes:     Pupils: Pupils are equal, round, and reactive to light.  Cardiovascular:     Rate and Rhythm: Normal rate and regular rhythm.     Pulses: Normal pulses.     Heart sounds: Normal heart sounds.  Pulmonary:     Effort: Pulmonary effort is normal.     Breath sounds: Normal breath sounds.  Abdominal:     General: Abdomen is flat. Bowel sounds are normal.     Palpations: Abdomen is soft.     Tenderness: There is no abdominal tenderness. There is no rebound.  Genitourinary:    Rectum: Normal. No tenderness.     Comments: No rash visualized on exam Musculoskeletal:     Right lower leg: No edema.     Left lower leg: No edema.     Right foot: Normal range of motion.     Left foot: Normal range of motion.  Feet:     Right foot:     Protective Sensation: 10 sites tested.  10 sites sensed.     Skin integrity: Skin integrity normal. No callus.     Toenail Condition: Right toenails are normal.     Left foot:     Protective Sensation: 10 sites tested.  10 sites sensed.     Skin integrity: Skin integrity normal. No callus.     Toenail Condition: Left toenails are normal.  Skin:    General: Skin is warm.  Neurological:     Mental Status: He is alert.         BP 116/84   Pulse 75   Temp 97.7 F (36.5 C) (Temporal)   Ht 5\' 5"  (1.651 m)   Wt 167 lb (75.8 kg)   SpO2 98%   BMI 27.79 kg/m  Wt Readings from Last 3 Encounters:  08/21/21 167 lb (75.8 kg)  01/28/20 171 lb 12 oz (77.9 kg)  11/23/18 163 lb (73.9 kg)     Health Maintenance Due  Topic Date Due   Hepatitis C Screening  Never done     There are no preventive care reminders to display for this patient.  Lab Results  Component Value Date  TSH 0.92 08/21/2021   Lab Results  Component Value Date   WBC 4.5 08/21/2021   HGB 15.1 08/21/2021   HCT 44.9 08/21/2021   MCV 87.8 08/21/2021   PLT 192.0 08/21/2021   Lab Results  Component Value Date   NA 137 08/21/2021   K 3.7 08/21/2021   CO2 26 08/21/2021   GLUCOSE 105 (H) 08/21/2021   BUN 17 08/21/2021   CREATININE 0.84 08/21/2021   BILITOT 0.4 08/21/2021   ALKPHOS 81 08/21/2021   AST 28 08/21/2021   ALT 49 08/21/2021   PROT 7.5 08/21/2021   ALBUMIN 4.5 08/21/2021   CALCIUM 9.6 08/21/2021   ANIONGAP 9 09/19/2017   GFR 103.19 08/21/2021   Lab Results  Component Value Date   CHOL 231 (H) 08/21/2021   Lab Results  Component Value Date   HDL 44.20 08/21/2021   Lab Results  Component Value Date   LDLCALC 123 (H) 01/28/2020   Lab Results  Component Value Date   TRIG 246.0 (H) 08/21/2021   Lab Results  Component Value Date   CHOLHDL 5 08/21/2021   Lab Results  Component Value Date   HGBA1C 6.1 08/21/2021      Assessment & Plan:   Problem List Items Addressed This Visit       Digestive   Gastroesophageal reflux disease without esophagitis - Primary    Refilled omeprazole. Pt instructed to decrease spicy, fried fatty foods as well as decreased chocolate and caffeine. Instructed to f/u if no improvement of symptoms.        Relevant Medications   omeprazole (PRILOSEC) 20 MG capsule     Nervous and Auditory   Neuralgia and neuritis    Treatment failure to getting gel insoles for her shoes.  Will order B12 and folate as well as some other labs to rule out other etiologies for neuropathy.  Also have ordered hemoglobin A1c.  Consideration may be gabapentin in the future for relief of symptoms.  Wear comfortable fitting shoes when able.       Relevant Orders   B12 and Folate Panel (Completed)     Other   Hyperlipidemia    Work on a  low-cholesterol diet and exercise as tolerated we will obtain lipid panel pending results.       Relevant Orders   Lipid panel (Completed)   Encounter for general adult medical examination with abnormal findings    Patient Counseling(The following topics were reviewed):  -Nutrition: Stressed importance of moderation in sodium/caffeine intake, saturated fat and cholesterol, caloric balance, sufficient intake of fresh fruits, vegetables, and fiber.  -Stressed the importance of regular exercise.   -Substance Abuse: Discussed cessation/primary prevention of tobacco, alcohol, or other drug use; driving or other dangerous activities under the influence; availability of treatment for abuse.   -Injury prevention: Discussed safety belts, safety helmets, smoke detector, smoking near bedding or upholstery.   -Sexuality: Discussed sexually transmitted diseases, partner selection, use of condoms, avoidance of unintended pregnancy and contraceptive alternatives.   -Dental health: Discussed importance of regular tooth brushing, flossing, and dental visits.  -Health maintenance and immunizations reviewed. Please refer to Health maintenance section.  Return to care in 1 year for next preventative visit       Relevant Orders   Comprehensive metabolic panel (Completed)   TSH (Completed)   CBC with Differential/Platelet (Completed)   Constipation    Discussed use of over-the-counter stool softener as well as daily MiraLAX to see if there is any improvement in constipation  symptoms.  Patient also referred to GI for colonoscopy as well as further evaluation of rectal burning.      Relevant Medications   hydrocortisone (ANUSOL-HC) 25 MG suppository   Other Relevant Orders   Ambulatory referral to Gastroenterology   Hyperglycemia    Hemoglobin A1c ordered for today pending results.  Work on diabetic diet and exercise as tolerated.      Relevant Orders   Hemoglobin A1c (Completed)   Urinary urgency     Urinalysis collected today sending out to lab pending results.  Rule out UTI and also ordering hemoglobin A1c to rule out diabetes as a potential cause as well.      Relevant Orders   Urinalysis, Routine w reflex microscopic (Completed)   Urine Culture   Decreased libido    Testosterone ordered pending results.  If libido still affected and testosterone within normal limits and/or low will consider referral to urology.      Relevant Orders   Testosterone Total,Free,Bio, Males-([facility]) (Completed)    Meds ordered this encounter  Medications   omeprazole (PRILOSEC) 20 MG capsule    Sig: Take 1 capsule (20 mg total) by mouth daily.    Dispense:  90 capsule    Refill:  3    Order Specific Question:   Supervising Provider    Answer:   BEDSOLE, AMY E [[id number]]   hydrocortisone (ANUSOL-HC) 25 MG suppository    Sig: Place 1 suppository (25 mg total) rectally 2 (two) times daily as needed for hemorrhoids or anal itching.    Dispense:  12 suppository    Refill:  0    Order Specific Question:   Supervising Provider    Answer:   Ermalene Searing, AMY E [[id number]]    Follow-up: Return in about 3 months (around 11/19/2021) for for follow up on symptoms and possibly repeat lab work.    Mort Sawyers, FNP

## 2021-08-21 NOTE — Assessment & Plan Note (Signed)
Hemoglobin A1c ordered for today pending results.  Work on diabetic diet and exercise as tolerated.

## 2021-08-21 NOTE — Assessment & Plan Note (Signed)
Urinalysis collected today sending out to lab pending results.  Rule out UTI and also ordering hemoglobin A1c to rule out diabetes as a potential cause as well.

## 2021-08-21 NOTE — Assessment & Plan Note (Signed)
Patient Counseling(The following topics were reviewed): ° -Nutrition: Stressed importance of moderation in sodium/caffeine intake, saturated fat and cholesterol, caloric balance, sufficient intake of fresh fruits, vegetables, and fiber. ° -Stressed the importance of regular exercise.  ° -Substance Abuse: Discussed cessation/primary prevention of tobacco, alcohol, or other drug use; driving or other dangerous activities under the influence; availability of treatment for abuse.  ° -Injury prevention: Discussed safety belts, safety helmets, smoke detector, smoking near bedding or upholstery.  ° -Sexuality: Discussed sexually transmitted diseases, partner selection, use of condoms, avoidance of unintended pregnancy and contraceptive alternatives.  ° -Dental health: Discussed importance of regular tooth brushing, flossing, and dental visits. ° -Health maintenance and immunizations reviewed. Please refer to Health maintenance section. ° °Return to care in 1 year for next preventative visit ° °

## 2021-08-21 NOTE — Assessment & Plan Note (Signed)
Treatment failure to getting gel insoles for her shoes.  Will order B12 and folate as well as some other labs to rule out other etiologies for neuropathy.  Also have ordered hemoglobin A1c.  Consideration may be gabapentin in the future for relief of symptoms.  Wear comfortable fitting shoes when able.

## 2021-08-22 ENCOUNTER — Other Ambulatory Visit: Payer: Self-pay | Admitting: Family

## 2021-08-22 DIAGNOSIS — R7989 Other specified abnormal findings of blood chemistry: Secondary | ICD-10-CM

## 2021-08-22 LAB — URINE CULTURE
MICRO NUMBER:: 12707595
Result:: NO GROWTH
SPECIMEN QUALITY:: ADEQUATE

## 2021-08-24 LAB — TESTOSTERONE TOTAL,FREE,BIO, MALES
Albumin: 4.6 g/dL (ref 3.6–5.1)
Sex Hormone Binding: 19 nmol/L (ref 10–50)
Testosterone: 232 ng/dL — ABNORMAL LOW (ref 250–827)

## 2021-08-24 LAB — PSA, TOTAL WITH REFLEX TO PSA, FREE: PSA, Total: 1.3 ng/mL (ref <=4.0)

## 2021-08-25 ENCOUNTER — Encounter: Payer: Self-pay | Admitting: *Deleted

## 2021-09-01 ENCOUNTER — Telehealth: Payer: Self-pay | Admitting: Family

## 2021-09-01 NOTE — Telephone Encounter (Signed)
Tried calling the patient and he did not answer. LVM for patient to call back.   

## 2021-09-01 NOTE — Telephone Encounter (Signed)
Pt called stating he would like to know the results of his lab work. Please advise.

## 2021-09-07 NOTE — Telephone Encounter (Signed)
Completed in result note.  

## 2021-09-18 DIAGNOSIS — J019 Acute sinusitis, unspecified: Secondary | ICD-10-CM | POA: Diagnosis not present

## 2021-09-18 DIAGNOSIS — H109 Unspecified conjunctivitis: Secondary | ICD-10-CM | POA: Diagnosis not present

## 2021-09-18 DIAGNOSIS — B9689 Other specified bacterial agents as the cause of diseases classified elsewhere: Secondary | ICD-10-CM | POA: Diagnosis not present

## 2021-09-18 DIAGNOSIS — H66003 Acute suppurative otitis media without spontaneous rupture of ear drum, bilateral: Secondary | ICD-10-CM | POA: Diagnosis not present

## 2022-09-01 DIAGNOSIS — J069 Acute upper respiratory infection, unspecified: Secondary | ICD-10-CM | POA: Diagnosis not present

## 2022-09-01 DIAGNOSIS — Z03818 Encounter for observation for suspected exposure to other biological agents ruled out: Secondary | ICD-10-CM | POA: Diagnosis not present

## 2022-09-24 ENCOUNTER — Other Ambulatory Visit: Payer: Self-pay | Admitting: Family

## 2022-09-24 ENCOUNTER — Ambulatory Visit (INDEPENDENT_AMBULATORY_CARE_PROVIDER_SITE_OTHER): Payer: BC Managed Care – PPO | Admitting: Family

## 2022-09-24 ENCOUNTER — Encounter: Payer: Self-pay | Admitting: Family

## 2022-09-24 VITALS — BP 108/72 | HR 68 | Ht 65.0 in | Wt 167.0 lb

## 2022-09-24 DIAGNOSIS — R142 Eructation: Secondary | ICD-10-CM

## 2022-09-24 DIAGNOSIS — N6322 Unspecified lump in the left breast, upper inner quadrant: Secondary | ICD-10-CM

## 2022-09-24 DIAGNOSIS — R3915 Urgency of urination: Secondary | ICD-10-CM | POA: Diagnosis not present

## 2022-09-24 DIAGNOSIS — R0683 Snoring: Secondary | ICD-10-CM

## 2022-09-24 DIAGNOSIS — R198 Other specified symptoms and signs involving the digestive system and abdomen: Secondary | ICD-10-CM | POA: Diagnosis not present

## 2022-09-24 DIAGNOSIS — K5900 Constipation, unspecified: Secondary | ICD-10-CM | POA: Diagnosis not present

## 2022-09-24 DIAGNOSIS — E782 Mixed hyperlipidemia: Secondary | ICD-10-CM

## 2022-09-24 DIAGNOSIS — R3589 Other polyuria: Secondary | ICD-10-CM | POA: Diagnosis not present

## 2022-09-24 DIAGNOSIS — E538 Deficiency of other specified B group vitamins: Secondary | ICD-10-CM | POA: Diagnosis not present

## 2022-09-24 DIAGNOSIS — R631 Polydipsia: Secondary | ICD-10-CM

## 2022-09-24 DIAGNOSIS — R0689 Other abnormalities of breathing: Secondary | ICD-10-CM

## 2022-09-24 DIAGNOSIS — R7989 Other specified abnormal findings of blood chemistry: Secondary | ICD-10-CM

## 2022-09-24 DIAGNOSIS — K6289 Other specified diseases of anus and rectum: Secondary | ICD-10-CM | POA: Diagnosis not present

## 2022-09-24 DIAGNOSIS — Z1211 Encounter for screening for malignant neoplasm of colon: Secondary | ICD-10-CM

## 2022-09-24 DIAGNOSIS — R6882 Decreased libido: Secondary | ICD-10-CM

## 2022-09-24 DIAGNOSIS — R739 Hyperglycemia, unspecified: Secondary | ICD-10-CM

## 2022-09-24 DIAGNOSIS — K219 Gastro-esophageal reflux disease without esophagitis: Secondary | ICD-10-CM

## 2022-09-24 LAB — HEMOGLOBIN A1C: Hgb A1c MFr Bld: 5.9 % (ref 4.6–6.5)

## 2022-09-24 LAB — B12 AND FOLATE PANEL
Folate: 14.6 ng/mL (ref >5.9)
Vitamin B-12: 314 pg/mL (ref 211–911)

## 2022-09-24 LAB — COMPREHENSIVE METABOLIC PANEL
ALT: 32 U/L (ref 0–53)
AST: 20 U/L (ref 0–37)
Albumin: 4.9 g/dL (ref 3.5–5.2)
Alkaline Phosphatase: 89 U/L (ref 39–117)
BUN: 15 mg/dL (ref 6–23)
CO2: 31 mEq/L (ref 19–32)
Calcium: 10.1 mg/dL (ref 8.4–10.5)
Chloride: 103 mEq/L (ref 96–112)
Creatinine, Ser: 0.98 mg/dL (ref 0.40–1.50)
GFR: 90.55 mL/min (ref >60.00)
Glucose, Bld: 96 mg/dL (ref 70–99)
Potassium: 4.7 mEq/L (ref 3.5–5.1)
Sodium: 140 mEq/L (ref 135–145)
Total Bilirubin: 0.6 mg/dL (ref 0.2–1.2)
Total Protein: 7.7 g/dL (ref 6.0–8.3)

## 2022-09-24 LAB — H. PYLORI ANTIBODY, IGG: H Pylori IgG: NEGATIVE

## 2022-09-24 LAB — LIPID PANEL
Cholesterol: 247 mg/dL — ABNORMAL HIGH (ref 0–200)
HDL: 53.6 mg/dL (ref >39.00)
LDL Cholesterol: 175 mg/dL — ABNORMAL HIGH (ref 0–99)
NonHDL: 193.42
Total CHOL/HDL Ratio: 5
Triglycerides: 94 mg/dL (ref 0.0–149.0)
VLDL: 18.8 mg/dL (ref 0.0–40.0)

## 2022-09-24 LAB — URINALYSIS, ROUTINE W REFLEX MICROSCOPIC
Bilirubin Urine: NEGATIVE
Hgb urine dipstick: NEGATIVE
Ketones, ur: NEGATIVE
Leukocytes,Ua: NEGATIVE
Nitrite: NEGATIVE
RBC / HPF: NONE SEEN (ref 0–?)
Specific Gravity, Urine: 1.025 (ref 1.000–1.030)
Total Protein, Urine: NEGATIVE
Urine Glucose: NEGATIVE
Urobilinogen, UA: 0.2 (ref 0.0–1.0)
WBC, UA: NONE SEEN (ref 0–?)
pH: 6 (ref 5.0–8.0)

## 2022-09-24 LAB — CBC
HCT: 48.3 % (ref 39.0–52.0)
Hemoglobin: 16.4 g/dL (ref 13.0–17.0)
MCHC: 33.8 g/dL (ref 30.0–36.0)
MCV: 87.9 fl (ref 78.0–100.0)
Platelets: 198 10*3/uL (ref 150.0–400.0)
RBC: 5.5 Mil/uL (ref 4.22–5.81)
RDW: 12.8 % (ref 11.5–15.5)
WBC: 3.6 10*3/uL — ABNORMAL LOW (ref 4.0–10.5)

## 2022-09-24 MED ORDER — HYDROCORTISONE ACETATE 25 MG RE SUPP: 25.0000 mg | Freq: Two times a day (BID) | RECTAL | 0 refills | Status: DC

## 2022-09-24 MED ORDER — OMEPRAZOLE 20 MG PO CPDR: 20.0000 mg | DELAYED_RELEASE_CAPSULE | Freq: Every day | ORAL | 3 refills | Status: AC

## 2022-09-24 NOTE — Assessment & Plan Note (Signed)
Rx anusol supp as suspected internal hemorrhoids Pt is referred to gi for colonoscopy as well as due

## 2022-09-24 NOTE — Assessment & Plan Note (Signed)
Suspected diabetes with + polypdipsia and polyphagia  Ordering A1c pending results.

## 2022-09-24 NOTE — Progress Notes (Signed)
Established Patient Office Visit  Subjective:  Patient ID: Alejandro Barr, male    DOB: 1971-01-28  Age: 53 y.o. MRN: [id number]  CC:  Chief Complaint  Patient presents with   Urinary Frequency    Denies pain/burning, feels like he just has to go more often   skin pain    Left side of chest wall, comes and goes, states it's in his skin, denies chest pain/shob/arm or jaw pain    HPI Alejandro Barr is here today with concerns.   Skin pain on left side of chest wall, comes and goes, feels in the skin area. Feels pulling in the chest not worse with movements. No pain with pressure. Constant , all day , and for the last one year. Denies chest pain , sob and or palpitations. Quit cigarette smoking about three months ago because he states that the pain was worse with smoking. Denies pain with breathing. Has been a smoker for 10 years or so.   Urinary frequency and dysuria. He has urinary urgency as well. Going on for about the last two months.   Some rectal pressure when he is constipated it gets more, eating more yogurt and this has helped. Does not see blood in stool.   Often bloated after eating, and makes him feel full. Doesn't seem to  Does not notice an issue with eating red meats, but does find issue with eating pork.  Not sure if he has been bitten by a tick in the past or not.  Tried omeprazole   Wakes up not feeling rested at night, does snore often. When he drinks Barr he snores even more. Does wake up gasping for breath as well.   Past Medical History:  Diagnosis Date   Frequent headaches    Frequent headaches 11/23/2018   GERD (gastroesophageal reflux disease)     History reviewed. No pertinent surgical history.  Family History  Problem Relation Age of Onset   Diabetes Maternal Aunt    Diabetes Maternal Uncle    Cancer Neg Hx    Stroke Neg Hx    Heart disease Neg Hx     Social History   Socioeconomic History   Marital status: Married    Spouse name: Not on  file   Number of children: Not on file   Years of education: Not on file   Highest education level: Not on file  Occupational History    Employer: [employer]   Occupation: Dealer  Tobacco Use   Smoking status: Former    Types: Cigarettes    Quit date: 03/10/2021    Years since quitting: 1.5   Smokeless tobacco: Never  Vaping Use   Vaping Use: Former   Quit date: 07/25/2022   Substances: Nicotine  Substance and Sexual Activity   Alcohol use: Yes    Alcohol/week: 12.0 standard drinks of alcohol    Types: 12 Cans of Barr per week    Comment: occasional   Drug use: No   Sexual activity: Yes  Other Topics Concern   Not on file  Social History Narrative   Not on file   Social Determinants of Health   Financial Resource Strain: Not on file  Food Insecurity: Not on file  Transportation Needs: Not on file  Physical Activity: Not on file  Stress: Not on file  Social Connections: Not on file  Intimate Partner Violence: Not on file    Outpatient Medications Prior to Visit  Medication Sig Dispense Refill  omeprazole (PRILOSEC) 20 MG capsule Take 1 capsule (20 mg total) by mouth daily. (Patient not taking: Reported on 09/24/2022) 90 capsule 3   No facility-administered medications prior to visit.    No Known Allergies      Objective:    Physical Exam Constitutional:      Appearance: Normal appearance. He is normal weight.  HENT:     Head: Normocephalic.  Cardiovascular:     Rate and Rhythm: Normal rate and regular rhythm.  Pulmonary:     Effort: Pulmonary effort is normal.     Breath sounds: Normal breath sounds. No wheezing.  Chest:     Chest wall: Mass and tenderness (at site of palpated mass) present.  Breasts:    Left: Mass and tenderness present. No swelling, bleeding, inverted nipple, nipple discharge or skin change.    Abdominal:     General: Abdomen is flat.     Tenderness: There is no abdominal tenderness.  Neurological:     General: No  focal deficit present.     Mental Status: He is alert and oriented to person, place, and time. Mental status is at baseline.  Psychiatric:        Mood and Affect: Mood normal.        Behavior: Behavior normal.        Thought Content: Thought content normal.        Judgment: Judgment normal.     BP 108/72   Pulse 68   Ht 5\' 5"  (1.651 m)   Wt 167 lb (75.8 kg)   SpO2 97%   BMI 27.79 kg/m  Wt Readings from Last 3 Encounters:  09/24/22 167 lb (75.8 kg)  08/21/21 167 lb (75.8 kg)  01/28/20 171 lb 12 oz (77.9 kg)     Health Maintenance Due  Topic Date Due   Hepatitis C Screening  Never done   COLONOSCOPY (Pts 45-40yrs Insurance coverage will need to be confirmed)  Never done   INFLUENZA VACCINE  04/20/2022   COVID-19 Vaccine (4 - 2023-24 season) 05/21/2022    There are no preventive care reminders to display for this patient.  Lab Results  Component Value Date   TSH 0.92 08/21/2021   Lab Results  Component Value Date   WBC 4.5 08/21/2021   HGB 15.1 08/21/2021   HCT 44.9 08/21/2021   MCV 87.8 08/21/2021   PLT 192.0 08/21/2021   Lab Results  Component Value Date   NA 137 08/21/2021   K 3.7 08/21/2021   CO2 26 08/21/2021   GLUCOSE 105 (H) 08/21/2021   BUN 17 08/21/2021   CREATININE 0.84 08/21/2021   BILITOT 0.4 08/21/2021   ALKPHOS 81 08/21/2021   AST 28 08/21/2021   ALT 49 08/21/2021   PROT 7.5 08/21/2021   ALBUMIN 4.5 08/21/2021   CALCIUM 9.6 08/21/2021   ANIONGAP 9 09/19/2017   GFR 103.19 08/21/2021   Lab Results  Component Value Date   HGBA1C 6.1 08/21/2021      Assessment & Plan:   Problem List Items Addressed This Visit       Digestive   Gastroesophageal reflux disease without esophagitis    Try to decrease and or avoid spicy foods, fried fatty foods, and also caffeine and chocolate as these can increase heartburn symptoms.   Rx trial omeprazole 20 mg x thirty days       Relevant Medications   omeprazole (PRILOSEC) 20 MG capsule      Other   Hyperlipidemia   Relevant  Orders   Lipid panel   Constipation - Primary    Recommended daily stool softener and miralax       Relevant Medications   hydrocortisone (ANUSOL-HC) 25 MG suppository   Hyperglycemia    Suspected diabetes with + polypdipsia and polyphagia  Ordering A1c pending results.       Urinary urgency    Urine culture and urinalysis today however suspect this could be possible diabetic polydipsia       Relevant Orders   Urinalysis, Routine w reflex microscopic   Urine Culture   Decreased libido    Testing for low testosterone again as low in 2022      Low testosterone in male   Relevant Orders   Testosterone Total,Free,Bio, Males   Burping    Trial omeprazole ordering alpha gal and h pylori pending results       Relevant Orders   Alpha-Gal Panel   H. pylori antibody, IgG   Comprehensive metabolic panel   Mass of upper inner quadrant of left breast    Breast u/s ordered pending results. If negative for breast mass consider superficial cyst       Relevant Orders   US BREAST LTD UNI LEFT INC AXILLA   Snoring    Suspected sleep apnea, sending for sleep study       Relevant Orders   Ambulatory referral to Pulmonology   Rectal pain    Rx anusol supp as suspected internal hemorrhoids Pt is referred to gi for colonoscopy as well as due       Relevant Medications   hydrocortisone (ANUSOL-HC) 25 MG suppository   Other Visit Diagnoses     Abdominal fullness       Relevant Orders   Alpha-Gal Panel   H. pylori antibody, IgG   CBC   Comprehensive metabolic panel   Polydipsia       Relevant Orders   CBC   Urinalysis, Routine w reflex microscopic   Urine Culture   Comprehensive metabolic panel   Hemoglobin A1c   Polyuria       Relevant Orders   CBC   Urinalysis, Routine w reflex microscopic   Urine Culture   Comprehensive metabolic panel   Hemoglobin A1c   Gasping for breath       Relevant Orders   Ambulatory referral to  Pulmonology   Low serum vitamin B12       Relevant Orders   B12 and Folate Panel   Screening for colon cancer       Relevant Orders   Ambulatory referral to Gastroenterology       Meds ordered this encounter  Medications   hydrocortisone (ANUSOL-HC) 25 MG suppository    Sig: Place 1 suppository (25 mg total) rectally 2 (two) times daily.    Dispense:  12 suppository    Refill:  0    Order Specific Question:   Supervising Provider    Answer:   BEDSOLE, AMY E [[id number]]   omeprazole (PRILOSEC) 20 MG capsule    Sig: Take 1 capsule (20 mg total) by mouth daily.    Dispense:  90 capsule    Refill:  3    Order Specific Question:   Supervising Provider    Answer:   Diona Browner, AMY E [[id number]]    Follow-up: Return in about 2 weeks (around 10/08/2022) for f/u abdominal symptoms .    Eugenia Pancoast, FNP

## 2022-09-24 NOTE — Assessment & Plan Note (Signed)
Recommended daily stool softener and miralax

## 2022-09-24 NOTE — Assessment & Plan Note (Signed)
Try to decrease and or avoid spicy foods, fried fatty foods, and also caffeine and chocolate as these can increase heartburn symptoms.   Rx trial omeprazole 20 mg x thirty days

## 2022-09-24 NOTE — Patient Instructions (Addendum)
  Sent in some hemorrhoid suppositories to use as I suspect that you have hemorrhoids interally that you can not see. See if this helps with the rectal pressure.    ------------------------------------ A referral was placed today for Gi to set up your colonoscopy.  Please let us know if you have not heard back within 1 week about your referral.   ------------------------------------  I have sent an electronic order over to your preferred location for the following:   Ultrasound left breast  Please give this center a call to get scheduled at your convenience.  [x]   [facility]  [place] [address]  [phone]  ------------------------------------   A referral was placed today. Please let us know if you have not heard back within 2 weeks about the referral.   ------------------------------------  Stop by the lab prior to leaving today. I will notify you of your results once received.   ------------------------------------ Start trial again of omeprazole 20 mg once daily and see if this helps with pain and or bloating. Take this for thirty days. Try to decrease and or avoid spicy foods, fried fatty foods, and also caffeine and chocolate as these can increase heartburn symptoms.    ------------------------------------  Regards,   Eugenia Pancoast FNP-C

## 2022-09-24 NOTE — Assessment & Plan Note (Signed)
Breast u/s ordered pending results. If negative for breast mass consider superficial cyst

## 2022-09-24 NOTE — Assessment & Plan Note (Signed)
Suspected sleep apnea, sending for sleep study

## 2022-09-24 NOTE — Assessment & Plan Note (Signed)
Urine culture and urinalysis today however suspect this could be possible diabetic polydipsia

## 2022-09-24 NOTE — Assessment & Plan Note (Signed)
Testing for low testosterone again as low in 2022

## 2022-09-24 NOTE — Assessment & Plan Note (Signed)
Trial omeprazole ordering alpha gal and h pylori pending results

## 2022-09-27 LAB — ALPHA-GAL PANEL
Allergen, Mutton, f88: <0.1 kU/L
Allergen, Pork, f26: <0.1 kU/L
Beef: 0.11 kU/L — ABNORMAL HIGH
CLASS: 0
Class: 0
GALACTOSE-ALPHA-1,3-GALACTOSE IGE*: 0.16 kU/L — ABNORMAL HIGH (ref <0.10)

## 2022-09-27 LAB — TESTOSTERONE TOTAL,FREE,BIO, MALES
Albumin: 5.1 g/dL (ref 3.6–5.1)
Sex Hormone Binding: 23 nmol/L (ref 10–50)
Testosterone, Bioavailable: 158.1 ng/dL (ref 110.0–575.0)
Testosterone, Free: 68.2 pg/mL (ref 46.0–224.0)
Testosterone: 412 ng/dL (ref 250–827)

## 2022-09-27 LAB — URINE CULTURE
MICRO NUMBER:: 14394339
Result:: NO GROWTH
SPECIMEN QUALITY:: ADEQUATE

## 2022-09-27 LAB — INTERPRETATION:

## 2022-09-27 NOTE — Progress Notes (Signed)
White blood cell slightly decreased, not overly.  Cholesterol much too high is pt willing to take cholesterol medication, if yes I will send in lipitor. Pt to start taking nightly and f/u in three months fasting for repeat blood level.   Urine negative for infection.  Testosterone better. H pylori negative. B12 on lower end normal, recommend daily otc 1000 mcg b12

## 2022-09-28 ENCOUNTER — Telehealth: Payer: Self-pay

## 2022-09-28 ENCOUNTER — Other Ambulatory Visit: Payer: Self-pay

## 2022-09-28 DIAGNOSIS — Z1211 Encounter for screening for malignant neoplasm of colon: Secondary | ICD-10-CM

## 2022-09-28 MED ORDER — NA SULFATE-K SULFATE-MG SULF 17.5-3.13-1.6 GM/177ML PO SOLN: 1.0000 | Freq: Once | ORAL | 0 refills | Status: AC

## 2022-09-28 NOTE — Telephone Encounter (Signed)
Gastroenterology Pre-Procedure Review  Request Date: 10/12/22 Requesting Physician: Dr. Vicente Males  PATIENT REVIEW QUESTIONS: The patient responded to the following health history questions as indicated:    1. Are you having any GI issues? yes (constipation occasionally, heartburn just started omeprazole is helping a little, hemorrhoid. Advised patient that we can schedule an appt after the colonoscopy if he would like to discuss hemorrhoid treatment) 2. Do you have a personal history of Polyps? no 3. Do you have a family history of Colon Cancer or Polyps? no 4. Diabetes Mellitus? no 5. Joint replacements in the past 12 months?no 6. Major health problems in the past 3 months?no 7. Any artificial heart valves, MVP, or defibrillator?no    MEDICATIONS & ALLERGIES:    Patient reports the following regarding taking any anticoagulation/antiplatelet therapy:   Plavix, Coumadin, Eliquis, Xarelto, Lovenox, Pradaxa, Brilinta, or Effient? no Aspirin? no  Patient confirms/reports the following medications:  Current Outpatient Medications  Medication Sig Dispense Refill   hydrocortisone (ANUSOL-HC) 25 MG suppository Place 1 suppository (25 mg total) rectally 2 (two) times daily. 12 suppository 0   omeprazole (PRILOSEC) 20 MG capsule Take 1 capsule (20 mg total) by mouth daily. 90 capsule 3   No current facility-administered medications for this visit.    Patient confirms/reports the following allergies:  No Known Allergies  No orders of the defined types were placed in this encounter.   AUTHORIZATION INFORMATION Primary Insurance: 1D#: Group #:  Secondary Insurance: 1D#: Group #:  SCHEDULE INFORMATION: Date: 10/12/22 Time: Location: [facility]

## 2022-09-28 NOTE — Progress Notes (Signed)
Called and left VM with detailed information for wife Roselyn Reef.  Advised positive for alpha gal and to avoid beef/red meat.  Advised to call back with any further questions.

## 2022-10-01 ENCOUNTER — Ambulatory Visit
Admission: RE | Admit: 2022-10-01 | Discharge: 2022-10-01 | Disposition: A | Discharge status: Home / Self Care | Payer: BC Managed Care – PPO | Source: Ambulatory Visit | Attending: Family | Admitting: Family

## 2022-10-01 DIAGNOSIS — N6322 Unspecified lump in the left breast, upper inner quadrant: Secondary | ICD-10-CM

## 2022-10-01 DIAGNOSIS — R928 Other abnormal and inconclusive findings on diagnostic imaging of breast: Secondary | ICD-10-CM | POA: Diagnosis not present

## 2022-10-04 ENCOUNTER — Other Ambulatory Visit: Payer: Self-pay | Admitting: Family

## 2022-10-04 DIAGNOSIS — R222 Localized swelling, mass and lump, trunk: Secondary | ICD-10-CM

## 2022-10-04 DIAGNOSIS — R0789 Other chest pain: Secondary | ICD-10-CM

## 2022-10-04 DIAGNOSIS — N6322 Unspecified lump in the left breast, upper inner quadrant: Secondary | ICD-10-CM

## 2022-10-04 NOTE — Progress Notes (Signed)
Ultrasound and mammogram were unconcerning for anything breast related.  At this point lets order a CT of the chest to evaluate further for a soft tissue mass.  I have placed an order you should hear something in the next 1 week from my referral team to get this scheduled.

## 2022-10-11 ENCOUNTER — Encounter: Payer: Self-pay | Admitting: Gastroenterology

## 2022-10-11 ENCOUNTER — Other Ambulatory Visit: Payer: Self-pay | Admitting: Family

## 2022-10-11 ENCOUNTER — Telehealth: Payer: Self-pay

## 2022-10-11 DIAGNOSIS — R0789 Other chest pain: Secondary | ICD-10-CM

## 2022-10-11 NOTE — Telephone Encounter (Signed)
Patient was contacted with the assistance of [employer].  He stated that he did not receive his instructions for his procedure.  Confirmed address on file during triage and during this phone call.  Address on file correct.  He also stated that pharmacy did not have his colonoscopy prep.  Informed patient that his pharmacy does have his colonoscopy prep as confirmation was received when rx was sent to pharmacy on 09/28/22.  Since instructions were not received colonoscopy has been rescheduled to 11/01/22 with Trish in Endo. Left voice message with [facility] on [facility] requesting to fill patients rx and contact him when rx is ready for pick up.  Thanks,  Sharyn Lull, [employer] Interpreter Assisted Call: ID [phone]

## 2022-10-11 NOTE — Telephone Encounter (Signed)
Patient is calling to ask a question about his procedure. Please call him when you have a chance since his procedure is tomorrow. That's all he left on my voicemail. Thank you.

## 2022-10-11 NOTE — Progress Notes (Signed)
Unable to get CT approved without CXR for chest wall tenderness/mass.  Will order CXR and await results, then order CT if necessary.

## 2022-10-25 ENCOUNTER — Institutional Professional Consult (permissible substitution): Payer: BC Managed Care – PPO | Admitting: Adult Health

## 2022-10-28 ENCOUNTER — Ambulatory Visit (INDEPENDENT_AMBULATORY_CARE_PROVIDER_SITE_OTHER)
Admission: RE | Admit: 2022-10-28 | Discharge: 2022-10-28 | Disposition: A | Discharge status: Home / Self Care | Payer: BC Managed Care – PPO | Source: Ambulatory Visit | Attending: Family | Admitting: Family

## 2022-10-28 ENCOUNTER — Encounter: Payer: Self-pay | Admitting: Family

## 2022-10-28 DIAGNOSIS — R0789 Other chest pain: Secondary | ICD-10-CM

## 2022-10-29 NOTE — Progress Notes (Signed)
Chest xray negative for acute concerns. Ashtyn now that we have CXR do you think they would allow [place] to order CT? Please call pt with results Alejandro Barr pool

## 2022-11-01 ENCOUNTER — Ambulatory Visit: Payer: BC Managed Care – PPO | Admitting: Certified Registered Nurse Anesthetist

## 2022-11-01 ENCOUNTER — Ambulatory Visit
Admission: RE | Admit: 2022-11-01 | Discharge: 2022-11-01 | Disposition: A | Discharge status: Home / Self Care | Payer: BC Managed Care – PPO | Source: Ambulatory Visit | Attending: Gastroenterology | Admitting: Gastroenterology

## 2022-11-01 ENCOUNTER — Encounter
Admission: RE | Disposition: A | Discharge status: Home / Self Care | Payer: Self-pay | Source: Ambulatory Visit | Attending: Gastroenterology

## 2022-11-01 DIAGNOSIS — E785 Hyperlipidemia, unspecified: Secondary | ICD-10-CM | POA: Diagnosis not present

## 2022-11-01 DIAGNOSIS — Z1211 Encounter for screening for malignant neoplasm of colon: Secondary | ICD-10-CM | POA: Insufficient documentation

## 2022-11-01 DIAGNOSIS — K219 Gastro-esophageal reflux disease without esophagitis: Secondary | ICD-10-CM | POA: Diagnosis not present

## 2022-11-01 DIAGNOSIS — Z87891 Personal history of nicotine dependence: Secondary | ICD-10-CM | POA: Diagnosis not present

## 2022-11-01 HISTORY — PX: COLONOSCOPY WITH PROPOFOL: SHX5780

## 2022-11-01 SURGERY — COLONOSCOPY WITH PROPOFOL
Anesthesia: General

## 2022-11-01 MED ORDER — PROPOFOL 10 MG/ML IV BOLUS
INTRAVENOUS | Status: DC | PRN
  Administered 2022-11-01: 100 mg via INTRAVENOUS

## 2022-11-01 MED ORDER — LIDOCAINE HCL (CARDIAC) PF 100 MG/5ML IV SOSY
PREFILLED_SYRINGE | INTRAVENOUS | Status: DC | PRN
  Administered 2022-11-01: 50 mg via INTRAVENOUS

## 2022-11-01 MED ORDER — SODIUM CHLORIDE 0.9 % IV SOLN
INTRAVENOUS | Status: DC
  Administered 2022-11-01: 20 mL/h via INTRAVENOUS

## 2022-11-01 MED ORDER — PROPOFOL 500 MG/50ML IV EMUL
INTRAVENOUS | Status: DC | PRN
  Administered 2022-11-01: 150 ug/kg/min via INTRAVENOUS

## 2022-11-01 NOTE — Op Note (Signed)
[facility]enterology Patient Name: Devarius Stromquist Procedure Date: 11/01/2022 9:48 AM MRN: [id number] Account #: [id number] Date of Birth: 02-04-1971 Admit Type: Outpatient Age: 52 Room: [facility] ENDO ROOM 1 Gender: Male Note Status: Finalized Instrument Name: Jasper Riling [id number] Procedure:             Colonoscopy Indications:           Screening for colorectal malignant neoplasm Providers:             Jonathon Bellows MD, MD Referring MD:          Eugenia Pancoast (Referring MD) Medicines:             Monitored Anesthesia Care Complications:         No immediate complications. Procedure:             Pre-Anesthesia Assessment:                        - Prior to the procedure, a History and Physical was                         performed, and patient medications, allergies and                         sensitivities were reviewed. The patient's tolerance                         of previous anesthesia was reviewed.                        - The risks and benefits of the procedure and the                         sedation options and risks were discussed with the                         patient. All questions were answered and informed                         consent was obtained.                        - ASA Grade Assessment: II - A patient with mild                         systemic disease.                        - After reviewing the risks and benefits, the patient                         was deemed in satisfactory condition to undergo the                         procedure.                        After obtaining informed consent, the colonoscope was                         passed under direct vision. Throughout the  procedure,                         the patient's blood pressure, pulse, and oxygen                         saturations were monitored continuously. The                         Colonoscope was introduced through the anus and                         advanced to the  the cecum, identified by the                         appendiceal orifice. The colonoscopy was performed                         with ease. The patient tolerated the procedure well.                         The quality of the bowel preparation was good. The                         ileocecal valve, appendiceal orifice, and rectum were                         photographed. Findings:      The perianal and digital rectal examinations were normal.      The entire examined colon appeared normal on direct and retroflexion       views. Impression:            - The entire examined colon is normal on direct and                         retroflexion views.                        - No specimens collected. Recommendation:        - Discharge patient to home (with escort).                        - Resume previous diet.                        - Continue present medications.                        - Repeat colonoscopy in 10 years for screening                         purposes. Procedure Code(s):     --- Professional ---                        [phone], Colonoscopy, flexible; diagnostic, including                         collection of specimen(s) by brushing or washing, when  performed (separate procedure) Diagnosis Code(s):     --- Professional ---                        Z12.11, Encounter for screening for malignant neoplasm                         of colon CPT copyright 2022 American Medical Association. All rights reserved. The codes documented in this report are preliminary and upon coder review may  be revised to meet current compliance requirements. Jonathon Bellows, MD Jonathon Bellows MD, MD 11/01/2022 10:07:45 AM This report has been signed electronically. Number of Addenda: 0 Note Initiated On: 11/01/2022 9:48 AM Scope Withdrawal Time: 0 hours 6 minutes 41 seconds  Total Procedure Duration: 0 hours 9 minutes 49 seconds  Estimated Blood Loss:  Estimated blood loss: none.      [facility]

## 2022-11-01 NOTE — Anesthesia Postprocedure Evaluation (Signed)
Anesthesia Post Note  Patient: Alejandro Barr  Procedure(s) Performed: COLONOSCOPY WITH PROPOFOL  Patient location during evaluation: Endoscopy Anesthesia Type: General Level of consciousness: awake and alert Pain management: pain level controlled Vital Signs Assessment: post-procedure vital signs reviewed and stable Respiratory status: spontaneous breathing, nonlabored ventilation and respiratory function stable Cardiovascular status: blood pressure returned to baseline and stable Postop Assessment: no apparent nausea or vomiting Anesthetic complications: no   No notable events documented.   Last Vitals:  Vitals:   11/01/22 1023 11/01/22 1033  BP: 99/60 114/65  Pulse: 63 64  Resp: 15 14  Temp:    SpO2: 100% 100%    Last Pain:  Vitals:   11/01/22 1033  TempSrc:   PainSc: 0-No pain                 [place] Ouch

## 2022-11-01 NOTE — Anesthesia Preprocedure Evaluation (Signed)
Anesthesia Evaluation  Patient identified by MRN, date of birth, ID band Patient awake    Reviewed: Allergy & Precautions, H&P , NPO status , Patient's Chart, lab work & pertinent test results  Airway Mallampati: II  TM Distance: >3 FB Neck ROM: full    Dental no notable dental hx.    Pulmonary neg pulmonary ROS, former smoker   Pulmonary exam normal        Cardiovascular negative cardio ROS Normal cardiovascular exam     Neuro/Psych  Headaches  negative psych ROS   GI/Hepatic Neg liver ROS,GERD  Controlled,,  Endo/Other  negative endocrine ROS    Renal/GU negative Renal ROS  negative genitourinary   Musculoskeletal   Abdominal   Peds  Hematology negative hematology ROS (+)   Anesthesia Other Findings Past Medical History: No date: Frequent headaches 11/23/2018: Frequent headaches No date: GERD (gastroesophageal reflux disease)  History reviewed. No pertinent surgical history.  BMI    Body Mass Index: 27.79 kg/m      Reproductive/Obstetrics negative OB ROS                             Anesthesia Physical Anesthesia Plan  ASA: 1  Anesthesia Plan: General   Post-op Pain Management: Minimal or no pain anticipated   Induction:   PONV Risk Score and Plan: Propofol infusion and TIVA  Airway Management Planned: Natural Airway  Additional Equipment:   Intra-op Plan:   Post-operative Plan:   Informed Consent: I have reviewed the patients History and Physical, chart, labs and discussed the procedure including the risks, benefits and alternatives for the proposed anesthesia with the patient or authorized representative who has indicated his/her understanding and acceptance.     Dental Advisory Given  Plan Discussed with: CRNA and Surgeon  Anesthesia Plan Comments:         Anesthesia Quick Evaluation

## 2022-11-01 NOTE — H&P (Signed)
Jonathon Bellows, MD [address] [place], [place], [place], [address] [id number], [place], [place], [place], [address] Phone: [phone]  Fax: [phone]  Primary Care Physician:  Eugenia Pancoast, FNP   Pre-Procedure History & Physical: HPI:  Alejandro Barr is a 52 y.o. male is here for an colonoscopy.   Past Medical History:  Diagnosis Date   Frequent headaches    Frequent headaches 11/23/2018   GERD (gastroesophageal reflux disease)     History reviewed. No pertinent surgical history.  Prior to Admission medications   Medication Sig Start Date End Date Taking? Authorizing Provider  hydrocortisone (ANUSOL-HC) 25 MG suppository Place 1 suppository (25 mg total) rectally 2 (two) times daily. 09/24/22  Yes Dugal, Lawerance Bach, FNP  omeprazole (PRILOSEC) 20 MG capsule Take 1 capsule (20 mg total) by mouth daily. 09/24/22  Yes Eugenia Pancoast, FNP    Allergies as of 09/28/2022   (No Known Allergies)    Family History  Problem Relation Age of Onset   Diabetes Maternal Aunt    Diabetes Maternal Uncle    Cancer Neg Hx    Stroke Neg Hx    Heart disease Neg Hx    Breast cancer Neg Hx     Social History   Socioeconomic History   Marital status: Married    Spouse name: Not on file   Number of children: Not on file   Years of education: Not on file   Highest education level: Not on file  Occupational History    Employer: [employer]   Occupation: Dealer  Tobacco Use   Smoking status: Former    Types: Cigarettes    Quit date: 03/10/2021    Years since quitting: 1.6   Smokeless tobacco: Never  Vaping Use   Vaping Use: Former   Quit date: 07/25/2022   Substances: Nicotine  Substance and Sexual Activity   Alcohol use: Yes    Alcohol/week: 12.0 standard drinks of alcohol    Types: 12 Cans of beer per week    Comment: occasional   Drug use: No   Sexual activity: Yes  Other Topics Concern   Not on file  Social History Narrative   Not on file   Social  Determinants of Health   Financial Resource Strain: Not on file  Food Insecurity: Not on file  Transportation Needs: Not on file  Physical Activity: Not on file  Stress: Not on file  Social Connections: Not on file  Intimate Partner Violence: Not on file    Review of Systems: See HPI, otherwise negative ROS  Physical Exam: BP 119/72   Pulse (!) 57   Temp (!) 97 F (36.1 C) (Temporal)   Resp 20   Ht 5' 5"$  (1.651 m)   Wt 75.8 kg   SpO2 99%   BMI 27.79 kg/m  General:   Alert,  pleasant and cooperative in NAD Head:  Normocephalic and atraumatic. Neck:  Supple; no masses or thyromegaly. Lungs:  Clear throughout to auscultation, normal respiratory effort.    Heart:  +S1, +S2, Regular rate and rhythm, No edema. Abdomen:  Soft, nontender and nondistended. Normal bowel sounds, without guarding, and without rebound.   Neurologic:  Alert and  oriented x4;  grossly normal neurologically.  Impression/Plan: Alejandro Barr is here for an colonoscopy to be performed for Screening colonoscopy average risk   Risks, benefits, limitations, and alternatives regarding  colonoscopy have been reviewed with the patient.  Questions have been answered.  All parties agreeable.  Jonathon Bellows, MD  11/01/2022, 9:47 AM

## 2022-11-01 NOTE — OR Nursing (Signed)
[employer] Interpreter used in [place].........Marland Kitchen Verdis Frederickson [phone]

## 2022-11-01 NOTE — Anesthesia Procedure Notes (Signed)
Date/Time: 11/01/2022 9:54 AM  Performed by: Johnna Acosta, CRNAPre-anesthesia Checklist: Patient identified, Emergency Drugs available, Patient being monitored, Suction available and Timeout performed Patient Re-evaluated:Patient Re-evaluated prior to induction Oxygen Delivery Method: Nasal cannula Preoxygenation: Pre-oxygenation with 100% oxygen Induction Type: IV induction

## 2022-11-01 NOTE — Transfer of Care (Signed)
Immediate Anesthesia Transfer of Care Note  Patient: Alejandro Barr  Procedure(s) Performed: COLONOSCOPY WITH PROPOFOL  Patient Location: Endoscopy Unit  Anesthesia Type:General  Level of Consciousness: sedated  Airway & Oxygen Therapy: Patient Spontanous Breathing  Post-op Assessment: Report given to RN and Post -op Vital signs reviewed and stable  Post vital signs: Reviewed and stable  Last Vitals:  Vitals Value Taken Time  BP 93/61 11/01/22 1013  Temp    Pulse 60 11/01/22 1013  Resp 13 11/01/22 1013  SpO2 100 % 11/01/22 1013    Last Pain:  Vitals:   11/01/22 0931  TempSrc: Temporal  PainSc: 0-No pain         Complications: No notable events documented.

## 2022-11-02 ENCOUNTER — Encounter: Payer: Self-pay | Admitting: Gastroenterology

## 2022-12-14 ENCOUNTER — Other Ambulatory Visit: Payer: Self-pay | Admitting: Family

## 2022-12-14 DIAGNOSIS — E782 Mixed hyperlipidemia: Secondary | ICD-10-CM

## 2022-12-14 DIAGNOSIS — D72818 Other decreased white blood cell count: Secondary | ICD-10-CM

## 2022-12-28 ENCOUNTER — Other Ambulatory Visit: Payer: Self-pay | Admitting: Family

## 2022-12-28 ENCOUNTER — Other Ambulatory Visit (INDEPENDENT_AMBULATORY_CARE_PROVIDER_SITE_OTHER): Payer: BC Managed Care – PPO

## 2022-12-28 DIAGNOSIS — E782 Mixed hyperlipidemia: Secondary | ICD-10-CM

## 2022-12-28 DIAGNOSIS — D72818 Other decreased white blood cell count: Secondary | ICD-10-CM

## 2022-12-28 LAB — LIPID PANEL
Cholesterol: 244 mg/dL — ABNORMAL HIGH (ref 0–200)
HDL: 48.3 mg/dL (ref >39.00)
LDL Cholesterol: 174 mg/dL — ABNORMAL HIGH (ref 0–99)
NonHDL: 195.83
Total CHOL/HDL Ratio: 5
Triglycerides: 110 mg/dL (ref 0.0–149.0)
VLDL: 22 mg/dL (ref 0.0–40.0)

## 2022-12-28 LAB — CBC WITH DIFFERENTIAL/PLATELET
Basophils Absolute: 0 10*3/uL (ref 0.0–0.1)
Basophils Relative: 0.9 % (ref 0.0–3.0)
Eosinophils Absolute: 0.2 10*3/uL (ref 0.0–0.7)
Eosinophils Relative: 4.6 % (ref 0.0–5.0)
HCT: 45.1 % (ref 39.0–52.0)
Hemoglobin: 15.5 g/dL (ref 13.0–17.0)
Lymphocytes Relative: 33.9 % (ref 12.0–46.0)
Lymphs Abs: 1.2 10*3/uL (ref 0.7–4.0)
MCHC: 34.3 g/dL (ref 30.0–36.0)
MCV: 87 fl (ref 78.0–100.0)
Monocytes Absolute: 0.4 10*3/uL (ref 0.1–1.0)
Monocytes Relative: 10.6 % (ref 3.0–12.0)
Neutro Abs: 1.8 10*3/uL (ref 1.4–7.7)
Neutrophils Relative %: 50 % (ref 43.0–77.0)
Platelets: 169 10*3/uL (ref 150.0–400.0)
RBC: 5.18 Mil/uL (ref 4.22–5.81)
RDW: 13 % (ref 11.5–15.5)
WBC: 3.6 10*3/uL — ABNORMAL LOW (ref 4.0–10.5)

## 2022-12-28 MED ORDER — ATORVASTATIN CALCIUM 10 MG PO TABS: 10.0000 mg | ORAL_TABLET | Freq: Every day | ORAL | 3 refills | Status: AC

## 2022-12-28 NOTE — Progress Notes (Signed)
Cholesterol is too high. I want to start a medication to help bring this down.  Start atorvastatin 10 mg once nightly.  F/u in office in three months.

## 2023-04-04 ENCOUNTER — Ambulatory Visit: Payer: BC Managed Care – PPO | Admitting: Family

## 2023-04-08 ENCOUNTER — Ambulatory Visit (INDEPENDENT_AMBULATORY_CARE_PROVIDER_SITE_OTHER): Payer: BC Managed Care – PPO | Admitting: Family

## 2023-04-08 ENCOUNTER — Encounter: Payer: Self-pay | Admitting: Family

## 2023-04-08 VITALS — BP 100/70 | HR 66 | Temp 97.6°F | Ht 65.0 in | Wt 175.0 lb

## 2023-04-08 DIAGNOSIS — E782 Mixed hyperlipidemia: Secondary | ICD-10-CM | POA: Diagnosis not present

## 2023-04-08 DIAGNOSIS — K219 Gastro-esophageal reflux disease without esophagitis: Secondary | ICD-10-CM | POA: Diagnosis not present

## 2023-04-08 DIAGNOSIS — T781XXA Other adverse food reactions, not elsewhere classified, initial encounter: Secondary | ICD-10-CM

## 2023-04-08 DIAGNOSIS — Z79899 Other long term (current) drug therapy: Secondary | ICD-10-CM | POA: Diagnosis not present

## 2023-04-08 NOTE — Assessment & Plan Note (Signed)
Improving with change in diet with dx of alpha gal.

## 2023-04-08 NOTE — Assessment & Plan Note (Signed)
Continue atorvastatin 10 mg nightly  Ordered lipid panel, pending results. Work on low cholesterol diet and exercise as tolerated

## 2023-04-08 NOTE — Assessment & Plan Note (Signed)
Continue to stay away from pork and beef.

## 2023-04-08 NOTE — Addendum Note (Signed)
Addended by: Shon Millet on: 04/08/2023 09:50 AM   Modules accepted: Orders

## 2023-04-08 NOTE — Progress Notes (Signed)
Established Patient Office Visit  Subjective:      CC:  Chief Complaint  Patient presents with   Hyperlipidemia    HPI: Alejandro Barr is a 52 y.o. male presenting on 04/08/2023 for Hyperlipidemia . Hyperlipidemia: advised last visit to start atorvastatin 10 mg nightly. Tolerating well.  Trying to change his diet a little better, eating more chicken and vegetables. Was eating a lot of red meat prior and also pork.  Lab Results  Component Value Date   CHOL 244 (H) 12/28/2022   HDL 48.30 12/28/2022   LDLCALC 174 (H) 12/28/2022   LDLDIRECT 175.0 08/21/2021   TRIG 110.0 12/28/2022   CHOLHDL 5 12/28/2022   Alpha gal: he stays away from beef since he found out he was alpha gal positive.  He has really been working on his diet and abdominal symptoms have improved.  Still taking omeprazole which has been helpful as well.    Social history:  Relevant past medical, surgical, family and social history reviewed and updated as indicated. Interim medical history since our last visit reviewed.  Allergies and medications reviewed and updated.  DATA REVIEWED: CHART IN EPIC     ROS: Negative unless specifically indicated above in HPI.    Current Outpatient Medications:    atorvastatin (LIPITOR) 10 MG tablet, Take 1 tablet (10 mg total) by mouth daily., Disp: 90 tablet, Rfl: 3   omeprazole (PRILOSEC) 20 MG capsule, Take 1 capsule (20 mg total) by mouth daily., Disp: 90 capsule, Rfl: 3      Objective:    BP 100/70 (BP Location: Left Arm, Patient Position: Sitting, Cuff Size: Large)   Pulse 66   Temp 97.6 F (36.4 C) (Temporal)   Ht 5\' 5"  (1.651 m)   Wt 175 lb (79.4 kg)   SpO2 97%   BMI 29.12 kg/m   Wt Readings from Last 3 Encounters:  04/08/23 175 lb (79.4 kg)  11/01/22 167 lb (75.8 kg)  09/24/22 167 lb (75.8 kg)    Physical Exam Constitutional:      General: He is not in acute distress.    Appearance: Normal appearance. He is obese. He is not ill-appearing,  toxic-appearing or diaphoretic.  Cardiovascular:     Rate and Rhythm: Normal rate.  Pulmonary:     Effort: Pulmonary effort is normal.  Musculoskeletal:        General: Normal range of motion.  Neurological:     General: No focal deficit present.     Mental Status: He is alert and oriented to person, place, and time. Mental status is at baseline.  Psychiatric:        Mood and Affect: Mood normal.        Behavior: Behavior normal.        Thought Content: Thought content normal.        Judgment: Judgment normal.           Assessment & Plan:  Mixed hyperlipidemia Assessment & Plan: Continue atorvastatin 10 mg nightly  Ordered lipid panel, pending results. Work on low cholesterol diet and exercise as tolerated   Orders: -     Lipid panel  On statin therapy -     Hepatic function panel  Gastroesophageal reflux disease without esophagitis Assessment & Plan: Improving with change in diet with dx of alpha gal.     Allergic reaction to alpha-gal Assessment & Plan: Continue to stay away from pork and beef.       Return in about 6 months (  around 10/09/2023) for f/u cholesterol.  Mort Sawyers, MSN, APRN, FNP-C [facility]

## 2023-04-09 LAB — LIPID PANEL
Cholesterol: 142 mg/dL (ref <200)
HDL: 46 mg/dL (ref >=40)
LDL Cholesterol (Calc): 77 mg/dL (calc)
Non-HDL Cholesterol (Calc): 96 mg/dL (calc) (ref <130)
Total CHOL/HDL Ratio: 3.1 (calc) (ref <5.0)
Triglycerides: 109 mg/dL (ref <150)

## 2023-04-09 LAB — HEPATIC FUNCTION PANEL
AG Ratio: 1.6 (calc) (ref 1.0–2.5)
ALT: 38 U/L (ref 9–46)
AST: 24 U/L (ref 10–35)
Albumin: 4.6 g/dL (ref 3.6–5.1)
Alkaline phosphatase (APISO): 89 U/L (ref 35–144)
Bilirubin, Direct: 0.1 mg/dL (ref 0.0–0.2)
Globulin: 2.8 g/dL (calc) (ref 1.9–3.7)
Indirect Bilirubin: 0.3 mg/dL (calc) (ref 0.2–1.2)
Total Bilirubin: 0.4 mg/dL (ref 0.2–1.2)
Total Protein: 7.4 g/dL (ref 6.1–8.1)

## 2023-04-11 NOTE — Progress Notes (Signed)
Cholesterol looks great Labs normal

## 2023-12-10 DIAGNOSIS — M5416 Radiculopathy, lumbar region: Secondary | ICD-10-CM | POA: Diagnosis not present

## 2024-06-19 ENCOUNTER — Telehealth: Payer: Self-pay | Admitting: Family

## 2024-06-19 NOTE — Telephone Encounter (Signed)
 Copied from CRM #[id number]. Topic: Appointments - Scheduling Inquiry for Clinic >> Jun 18, 2024  9:52 AM Debby BROCKS wrote: Reason for CRM: Patient's wife Equan Cogbill) is calling on behalf of the patient to make a physical appointment. Last one the patient had was on 09/24/2022. However Epic will not allow a physical until Feb of 2026. Patient would like to get one sooner than that  Pt states he will call back due to his scheduling

## 2024-06-19 NOTE — Telephone Encounter (Signed)
Unable to accommodate at this time.

## 2024-06-22 ENCOUNTER — Ambulatory Visit: Admitting: Family Medicine

## 2024-10-08 ENCOUNTER — Telehealth: Payer: Self-pay

## 2024-10-08 NOTE — Telephone Encounter (Signed)
 SABRA

## 2024-10-08 NOTE — Telephone Encounter (Signed)
 Sending to [facility] admin and Dugal pool.

## 2024-10-09 ENCOUNTER — Encounter: Admitting: Family
